# Patient Record
Sex: Male | Born: 1975 | Hispanic: No | Marital: Single | Smoking: Current every day smoker
Health system: Southern US, Community
[De-identification: ages and names within clinical notes are randomized; demographics above are authoritative.]

## PROBLEM LIST (undated history)

## (undated) DIAGNOSIS — J9383 Other pneumothorax: Secondary | ICD-10-CM

## (undated) DIAGNOSIS — L309 Dermatitis, unspecified: Secondary | ICD-10-CM

---

## 2000-02-12 ENCOUNTER — Emergency Department (HOSPITAL_COMMUNITY): Admission: EM | Admit: 2000-02-12 | Discharge: 2000-02-12 | Payer: Self-pay | Admitting: Emergency Medicine

## 2004-12-23 ENCOUNTER — Ambulatory Visit: Payer: Self-pay | Admitting: Internal Medicine

## 2004-12-24 ENCOUNTER — Ambulatory Visit: Payer: Self-pay | Admitting: Internal Medicine

## 2005-01-01 ENCOUNTER — Ambulatory Visit: Payer: Self-pay | Admitting: Internal Medicine

## 2005-01-11 ENCOUNTER — Ambulatory Visit: Payer: Self-pay | Admitting: Internal Medicine

## 2005-01-18 ENCOUNTER — Ambulatory Visit: Payer: Self-pay | Admitting: Internal Medicine

## 2009-07-09 ENCOUNTER — Inpatient Hospital Stay (HOSPITAL_COMMUNITY): Admission: EM | Admit: 2009-07-09 | Discharge: 2009-07-11 | Payer: Self-pay | Admitting: Emergency Medicine

## 2009-07-09 ENCOUNTER — Ambulatory Visit: Payer: Self-pay | Admitting: Surgery

## 2009-07-17 ENCOUNTER — Ambulatory Visit: Payer: Self-pay | Admitting: Surgery

## 2009-08-19 ENCOUNTER — Encounter: Admission: RE | Admit: 2009-08-19 | Discharge: 2009-08-19 | Payer: Self-pay | Admitting: Surgery

## 2009-08-20 ENCOUNTER — Ambulatory Visit: Payer: Self-pay | Admitting: Surgery

## 2010-07-19 ENCOUNTER — Encounter: Payer: Self-pay | Admitting: Surgery

## 2010-09-13 LAB — CBC
Hemoglobin: 16.7 g/dL (ref 13.0–17.0)
RBC: 5.05 MIL/uL (ref 4.22–5.81)

## 2010-09-13 LAB — BASIC METABOLIC PANEL
BUN: 11 mg/dL (ref 6–23)
CO2: 27 mEq/L (ref 19–32)
Calcium: 9.4 mg/dL (ref 8.4–10.5)
GFR calc non Af Amer: >60 mL/min (ref >60)
Glucose, Bld: 94 mg/dL (ref 70–99)

## 2010-09-13 LAB — PROTIME-INR
INR: 0.96 (ref 0.00–1.49)
Prothrombin Time: 12.7 seconds (ref 11.6–15.2)

## 2010-09-13 LAB — DIFFERENTIAL
Eosinophils Relative: 1 % (ref 0–5)
Lymphocytes Relative: 25 % (ref 12–46)
Monocytes Absolute: 0.6 10*3/uL (ref 0.1–1.0)

## 2010-10-05 DIAGNOSIS — Z0271 Encounter for disability determination: Secondary | ICD-10-CM
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
## 2010-12-24 DIAGNOSIS — Z0271 Encounter for disability determination: Secondary | ICD-10-CM

## 2013-09-30 ENCOUNTER — Emergency Department (HOSPITAL_COMMUNITY)
Admission: EM | Admit: 2013-09-30 | Discharge: 2013-09-30 | Disposition: A | Discharge status: Home / Self Care | Payer: Self-pay | Attending: Emergency Medicine | Admitting: Emergency Medicine

## 2013-09-30 ENCOUNTER — Encounter (HOSPITAL_COMMUNITY): Payer: Self-pay | Admitting: Emergency Medicine

## 2013-09-30 DIAGNOSIS — H1132 Conjunctival hemorrhage, left eye: Secondary | ICD-10-CM

## 2013-09-30 DIAGNOSIS — H1130 Conjunctival hemorrhage, unspecified eye: Secondary | ICD-10-CM | POA: Insufficient documentation

## 2013-09-30 DIAGNOSIS — Z8709 Personal history of other diseases of the respiratory system: Secondary | ICD-10-CM | POA: Insufficient documentation

## 2013-09-30 DIAGNOSIS — Z23 Encounter for immunization: Secondary | ICD-10-CM | POA: Insufficient documentation

## 2013-09-30 DIAGNOSIS — S01309A Unspecified open wound of unspecified ear, initial encounter: Secondary | ICD-10-CM | POA: Insufficient documentation

## 2013-09-30 DIAGNOSIS — S01319A Laceration without foreign body of unspecified ear, initial encounter: Secondary | ICD-10-CM

## 2013-09-30 HISTORY — DX: Other pneumothorax: J93.83

## 2013-09-30 MED ORDER — TETANUS-DIPHTH-ACELL PERTUSSIS 5-2.5-18.5 LF-MCG/0.5 IM SUSP
0.5000 mL | Freq: Once | INTRAMUSCULAR | Status: AC
  Administered 2013-09-30: 0.5 mL via INTRAMUSCULAR
  Filled 2013-09-30: qty 0.5
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
## 2013-09-30 NOTE — ED Provider Notes (Signed)
Medical screening examination/treatment/procedure(s) were performed by non-physician practitioner and as supervising physician I was immediately available for consultation/collaboration.   EKG Interpretation None        Aubri Gathright, MD 09/30/13 1720 
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
## 2015-08-10 ENCOUNTER — Inpatient Hospital Stay (HOSPITAL_COMMUNITY): Payer: Self-pay

## 2015-08-10 ENCOUNTER — Emergency Department (HOSPITAL_COMMUNITY): Payer: Self-pay

## 2015-08-10 ENCOUNTER — Encounter (HOSPITAL_COMMUNITY): Payer: Self-pay | Admitting: Emergency Medicine

## 2015-08-10 ENCOUNTER — Inpatient Hospital Stay (HOSPITAL_COMMUNITY)
Admission: EM | Admit: 2015-08-10 | Discharge: 2015-08-17 | DRG: 165 | Disposition: A | Discharge status: Home / Self Care | Payer: Self-pay | Attending: Thoracic Surgery (Cardiothoracic Vascular Surgery) | Admitting: Thoracic Surgery (Cardiothoracic Vascular Surgery)

## 2015-08-10 DIAGNOSIS — Z09 Encounter for follow-up examination after completed treatment for conditions other than malignant neoplasm: Secondary | ICD-10-CM

## 2015-08-10 DIAGNOSIS — J939 Pneumothorax, unspecified: Secondary | ICD-10-CM

## 2015-08-10 DIAGNOSIS — J9383 Other pneumothorax: Principal | ICD-10-CM | POA: Diagnosis present

## 2015-08-10 DIAGNOSIS — J439 Emphysema, unspecified: Secondary | ICD-10-CM | POA: Diagnosis present

## 2015-08-10 DIAGNOSIS — F17200 Nicotine dependence, unspecified, uncomplicated: Secondary | ICD-10-CM | POA: Diagnosis present

## 2015-08-10 DIAGNOSIS — Z01818 Encounter for other preprocedural examination: Secondary | ICD-10-CM

## 2015-08-10 DIAGNOSIS — Z9689 Presence of other specified functional implants: Secondary | ICD-10-CM

## 2015-08-10 DIAGNOSIS — Z4682 Encounter for fitting and adjustment of non-vascular catheter: Secondary | ICD-10-CM

## 2015-08-10 LAB — BASIC METABOLIC PANEL
ANION GAP: 14 (ref 5–15)
BUN: 12 mg/dL (ref 6–20)
CHLORIDE: 103 mmol/L (ref 101–111)
CO2: 25 mmol/L (ref 22–32)
CREATININE: 0.93 mg/dL (ref 0.61–1.24)
Calcium: 9.4 mg/dL (ref 8.9–10.3)
GFR calc non Af Amer: >60 mL/min (ref >60)
Glucose, Bld: 92 mg/dL (ref 65–99)
Potassium: 4.4 mmol/L (ref 3.5–5.1)
Sodium: 142 mmol/L (ref 135–145)

## 2015-08-10 LAB — CBC
HEMATOCRIT: 44.2 % (ref 39.0–52.0)
HEMOGLOBIN: 14.5 g/dL (ref 13.0–17.0)
MCH: 30.9 pg (ref 26.0–34.0)
MCHC: 32.8 g/dL (ref 30.0–36.0)
MCV: 94 fL (ref 78.0–100.0)
Platelets: 274 10*3/uL (ref 150–400)
RBC: 4.7 MIL/uL (ref 4.22–5.81)
RDW: 12.5 % (ref 11.5–15.5)
WBC: 10.3 10*3/uL (ref 4.0–10.5)

## 2015-08-10 LAB — I-STAT TROPONIN, ED: Troponin i, poc: 0 ng/mL (ref 0.00–0.08)

## 2015-08-10 LAB — APTT: APTT: 29 s (ref 24–37)

## 2015-08-10 MED ORDER — DIPHENHYDRAMINE HCL 12.5 MG/5ML PO ELIX: 12.5000 mg | ORAL_SOLUTION | Freq: Four times a day (QID) | ORAL | Status: DC | PRN

## 2015-08-10 MED ORDER — SODIUM CHLORIDE 0.9 % IV SOLN
INTRAVENOUS | Status: DC
  Administered 2015-08-12 – 2015-08-13 (×2): via INTRAVENOUS

## 2015-08-10 MED ORDER — SODIUM CHLORIDE 0.9% FLUSH: 9.0000 mL | INTRAVENOUS | Status: DC | PRN

## 2015-08-10 MED ORDER — ALUM & MAG HYDROXIDE-SIMETH 200-200-20 MG/5ML PO SUSP: 30.0000 mL | ORAL | Status: DC | PRN

## 2015-08-10 MED ORDER — MORPHINE SULFATE 2 MG/ML IV SOLN
INTRAVENOUS | Status: DC
  Administered 2015-08-10: 2 mg via INTRAVENOUS
  Administered 2015-08-10: 20:00:00 via INTRAVENOUS
  Administered 2015-08-11 (×2): 1.5 mg via INTRAVENOUS
  Administered 2015-08-11: 2 mg via INTRAVENOUS
  Administered 2015-08-11: 4.5 mg via INTRAVENOUS
  Administered 2015-08-12 – 2015-08-13 (×5): 1.5 mg via INTRAVENOUS
  Filled 2015-08-10: qty 25

## 2015-08-10 MED ORDER — ZOLPIDEM TARTRATE 5 MG PO TABS: 10.0000 mg | ORAL_TABLET | Freq: Every evening | ORAL | Status: DC | PRN

## 2015-08-10 MED ORDER — DIPHENHYDRAMINE HCL 50 MG/ML IJ SOLN: 12.5000 mg | Freq: Four times a day (QID) | INTRAMUSCULAR | Status: DC | PRN

## 2015-08-10 MED ORDER — ENOXAPARIN SODIUM 40 MG/0.4ML ~~LOC~~ SOLN
40.0000 mg | SUBCUTANEOUS | Status: DC
  Administered 2015-08-10 – 2015-08-12 (×3): 40 mg via SUBCUTANEOUS
  Filled 2015-08-10 (×3): qty 0.4

## 2015-08-10 MED ORDER — MIDAZOLAM HCL 2 MG/2ML IJ SOLN
2.0000 mg | Freq: Once | INTRAMUSCULAR | Status: AC
  Administered 2015-08-10: 2 mg via INTRAVENOUS
  Filled 2015-08-10: qty 2

## 2015-08-10 MED ORDER — ACETAMINOPHEN 500 MG PO TABS: 1000.0000 mg | ORAL_TABLET | Freq: Four times a day (QID) | ORAL | Status: DC | PRN

## 2015-08-10 MED ORDER — ONDANSETRON HCL 4 MG/2ML IJ SOLN: 4.0000 mg | Freq: Four times a day (QID) | INTRAMUSCULAR | Status: DC | PRN

## 2015-08-10 MED ORDER — NALOXONE HCL 0.4 MG/ML IJ SOLN: 0.4000 mg | INTRAMUSCULAR | Status: DC | PRN

## 2015-08-10 MED ORDER — LIDOCAINE HCL (PF) 1 % IJ SOLN
0.0000 mL | Freq: Once | INTRAMUSCULAR | Status: AC | PRN
  Administered 2015-08-10: 30 mL via INTRADERMAL
  Filled 2015-08-10: qty 30

## 2015-08-10 MED ORDER — MORPHINE SULFATE (PF) 4 MG/ML IV SOLN
4.0000 mg | Freq: Once | INTRAVENOUS | Status: AC
  Administered 2015-08-10: 4 mg via INTRAVENOUS
  Filled 2015-08-10: qty 1

## 2015-08-10 MED ORDER — MAGNESIUM HYDROXIDE 400 MG/5ML PO SUSP: 30.0000 mL | Freq: Every day | ORAL | Status: DC | PRN

## 2015-08-10 NOTE — Procedures (Signed)
Informed consent obtained Given 4 mg morphine and 2 mg versed Time out performed 30 F chest tube placed using 15 ml 1% lidocaine for local anesthesia +rush of air Tolerated well CT to suction  Viviann Spare C. Dorris Fetch, MD [facility] [phone]

## 2015-08-10 NOTE — ED Notes (Signed)
Successful chest tube placement at 1730. Pt tolerated well. Pt alert and oriented

## 2015-08-10 NOTE — ED Notes (Signed)
Attempted Report x1.   

## 2015-08-10 NOTE — ED Notes (Signed)
Pt states "I think I have a pneumothorax". Pt c/o difficulty breathing. Pt talking in clear sentences, in NAD. Lung sounds diminished bilaterally.

## 2015-08-10 NOTE — H&P (Signed)
Victor Jefferson is an 40 y.o. male.   Chief Complaint: chest pain HPI: 40 yo man with a history of bilateral spontaneous pneumothoraces, most recently on the right side in 2011. Presented to ED today with a cc/o sudden onset of right sided chest pain and shortness of breath. + pain with inspiration. SOB has eased off since arrival.  Past Medical History  Diagnosis Date  . Spontaneous pneumothorax     PSH Left chest tube 2010 Right chest tube 2011  Family history  Noncontributory  Social History:  reports that he has been smoking.  He does not have any smokeless tobacco history on file. He reports that he does not drink alcohol or use illicit drugs.  Allergies: No Known Allergies  MEDICATIONS: none  Results for orders placed or performed during the hospital encounter of 08/10/15 (from the past 48 hour(s))  Basic metabolic panel     Status: None   Collection Time: 08/10/15  3:21 PM  Result Value Ref Range   Sodium 142 135 - 145 mmol/L   Potassium 4.4 3.5 - 5.1 mmol/L   Chloride 103 101 - 111 mmol/L   CO2 25 22 - 32 mmol/L   Glucose, Bld 92 65 - 99 mg/dL   BUN 12 6 - 20 mg/dL   Creatinine, Ser 0.93 0.61 - 1.24 mg/dL   Calcium 9.4 8.9 - 10.3 mg/dL   GFR calc non Af Amer >60 >60 mL/min   GFR calc Af Amer >60 >60 mL/min    Comment: (NOTE) The eGFR has been calculated using the CKD EPI equation. This calculation has not been validated in all clinical situations. eGFR's persistently <60 mL/min signify possible Chronic Kidney Disease.    Anion gap 14 5 - 15  CBC     Status: None   Collection Time: 08/10/15  3:21 PM  Result Value Ref Range   WBC 10.3 4.0 - 10.5 K/uL   RBC 4.70 4.22 - 5.81 MIL/uL   Hemoglobin 14.5 13.0 - 17.0 g/dL   HCT 44.2 39.0 - 52.0 %   MCV 94.0 78.0 - 100.0 fL   MCH 30.9 26.0 - 34.0 pg   MCHC 32.8 30.0 - 36.0 g/dL   RDW 12.5 11.5 - 15.5 %   Platelets 274 150 - 400 K/uL  I-stat troponin, ED (not at [facility])     Status: None   Collection Time: 08/10/15   3:40 PM  Result Value Ref Range   Troponin i, poc 0.00 0.00 - 0.08 ng/mL   Comment 3            Comment: Due to the release kinetics of cTnI, a negative result within the first hours of the onset of symptoms does not rule out myocardial infarction with certainty. If myocardial infarction is still suspected, repeat the test at appropriate intervals.    Dg Chest 2 View  08/10/2015  CLINICAL DATA:  40 year old with prior history of spontaneous pneumothorax in 2006 and 2010, presenting with acute onset of right-sided chest pain earlier today, similar to those episodes. EXAM: CHEST  2 VIEW COMPARISON:  2/20 07/1009 and earlier. FINDINGS: Apical, lateral, anterior and basilar right pneumothorax on the order of 25% or so. Small right pleural effusion demonstrating an air-fluid level. Blebs in both lung apices. Small nodular opacity projected over the right mid lung on the PA image, not clearly localized on the lateral image and not visible on prior examinations. Lungs otherwise clear. No left pleural effusion. Cardiomediastinal silhouette unremarkable, unchanged. Visualized  bony thorax intact. IMPRESSION: 1. Spontaneous right hydropneumothorax on the order of 25% or so. 2. Blebs involving the lung apices, indicating age advanced emphysema. 3. Possible subcentimeter nodule in the right mid lung on the PA image, not visible on the lateral and not visible on prior examinations. CT of the chest with contrast may be helpful in further evaluation after treatment of the pneumothorax. Electronically Signed   By: Evangeline Dakin M.D.   On: 08/10/2015 15:49    Review of Systems  Respiratory: Positive for shortness of breath.        Pleuritic right chest pain  All other systems reviewed and are negative.   Blood pressure 113/71, pulse 77, temperature 98 F (36.7 C), resp. rate 20, SpO2 96 %. Physical Exam  Vitals reviewed. Constitutional: He is oriented to person, place, and time. He appears distressed  (mildly).  thin  HENT:  Head: Normocephalic and atraumatic.  Eyes: Conjunctivae and EOM are normal. Pupils are equal, round, and reactive to light. No scleral icterus.  Neck: Neck supple. No tracheal deviation present. No thyromegaly present.  Cardiovascular: Normal rate, regular rhythm, normal heart sounds and intact distal pulses.   No murmur heard. Respiratory: Effort normal. He has no wheezes.  Diminished BS on right  GI: Soft. He exhibits no distension. There is no tenderness.  Musculoskeletal: Normal range of motion. He exhibits no edema.  Lymphadenopathy:    He has no cervical adenopathy.  Neurological: He is alert and oriented to person, place, and time. No cranial nerve deficit. He exhibits normal muscle tone.  Skin: Skin is warm and dry.     Assessment/Plan 40 yo man with history of bilateral pneumothoraces who presents with a second spontaneous pneumothorax on the right. He needs a chest tube placement to reexpand the lung and then will need a CT to evaluate anatomy and right VATS stapling of blebs for definitive treatment.  I informed the patient and his parents of the indications, risks, benefits and alternatives. They are familiar with the procedure. He understands risks of CT placement include but are not limited to bleeding, infection, tube malposition as well as the possibility of other unforeseeable complications.  He agrees to CT placement  Will premed with morphine and versed IV  Melrose Nakayama, MD 08/10/2015, 5:03 PM

## 2015-08-10 NOTE — ED Notes (Signed)
Obtained consent for Chest tube placement. Supplies at bedside

## 2015-08-10 NOTE — ED Notes (Signed)
Radiology at bedside

## 2015-08-10 NOTE — ED Provider Notes (Signed)
CSN: [id number]     Arrival date & time 08/10/15  1502 History   First MD Initiated Contact with Patient 08/10/15 1557     Chief Complaint  Patient presents with  . Shortness of Breath     (Consider location/radiation/quality/duration/timing/severity/associated sxs/prior Treatment) Patient is a 40 y.o. male presenting with chest pain.  Chest Pain Pain location:  R chest Pain quality: aching   Pain radiates to:  Does not radiate Pain radiates to the back: yes   Pain severity:  Moderate Onset quality:  Sudden Duration:  2 hours Timing:  Constant Progression:  Unchanged Chronicity:  Recurrent (Similar to prior pneumothoraces) Context comment:  Patient was stretching Relieved by:  Nothing Worsened by:  Nothing tried Associated symptoms: shortness of breath (Mild)   Associated symptoms: no abdominal pain, no back pain, no cough, no dizziness, no fatigue, no fever, no headache, no nausea, no palpitations, not vomiting and no weakness     Past Medical History  Diagnosis Date  . Spontaneous pneumothorax    History reviewed. No pertinent past surgical history. No family history on file. Social History  Substance Use Topics  . Smoking status: Current Every Day Smoker  . Smokeless tobacco: None  . Alcohol Use: No    Review of Systems  Constitutional: Negative for fever, chills, appetite change and fatigue.  HENT: Negative for congestion, ear pain, facial swelling, mouth sores and sore throat.   Eyes: Negative for visual disturbance.  Respiratory: Positive for shortness of breath (Mild). Negative for cough and chest tightness.   Cardiovascular: Positive for chest pain. Negative for palpitations.  Gastrointestinal: Negative for nausea, vomiting, abdominal pain, diarrhea and blood in stool.  Endocrine: Negative for cold intolerance and heat intolerance.  Genitourinary: Negative for frequency, decreased urine volume and difficulty urinating.  Musculoskeletal: Negative for back  pain and neck stiffness.  Skin: Negative for rash.  Neurological: Negative for dizziness, weakness, light-headedness and headaches.  All other systems reviewed and are negative.     Allergies  Review of patient's allergies indicates no known allergies.  Home Medications   Prior to Admission medications   Not on File   BP 106/67 mmHg  Pulse 80  Temp(Src) 98 F (36.7 C)  Resp 20  SpO2 94% Physical Exam  Constitutional: He is oriented to person, place, and time. He appears well-nourished. No distress.  HENT:  Head: Normocephalic and atraumatic.  Right Ear: External ear normal.  Left Ear: External ear normal.  Eyes: Pupils are equal, round, and reactive to light. Right eye exhibits no discharge. Left eye exhibits no discharge. No scleral icterus.  Neck: Normal range of motion. Neck supple.  Cardiovascular: Normal rate.  Exam reveals no gallop and no friction rub.   No murmur heard. Pulmonary/Chest: Effort normal. No stridor. No respiratory distress. He has decreased breath sounds in the left middle field and the left lower field. He has no wheezes. He has no rales. He exhibits no tenderness.  Abdominal: Soft. He exhibits no distension and no mass. There is no tenderness. There is no rebound and no guarding.  Musculoskeletal: He exhibits no edema or tenderness.  Neurological: He is alert and oriented to person, place, and time.  Skin: Skin is warm and dry. No rash noted. He is not diaphoretic. No erythema.    ED Course  Procedures (including critical care time) Labs Review Labs Reviewed  BASIC METABOLIC PANEL  CBC  PROTIME-INR  APTT  I-STAT TROPOININ, ED    Imaging Review Dg Chest  2 View  08/10/2015  CLINICAL DATA:  40 year old with prior history of spontaneous pneumothorax in 2006 and 2010, presenting with acute onset of right-sided chest pain earlier today, similar to those episodes. EXAM: CHEST  2 VIEW COMPARISON:  2/20 07/1009 and earlier. FINDINGS: Apical, lateral,  anterior and basilar right pneumothorax on the order of 25% or so. Small right pleural effusion demonstrating an air-fluid level. Blebs in both lung apices. Small nodular opacity projected over the right mid lung on the PA image, not clearly localized on the lateral image and not visible on prior examinations. Lungs otherwise clear. No left pleural effusion. Cardiomediastinal silhouette unremarkable, unchanged. Visualized bony thorax intact. IMPRESSION: 1. Spontaneous right hydropneumothorax on the order of 25% or so. 2. Blebs involving the lung apices, indicating age advanced emphysema. 3. Possible subcentimeter nodule in the right mid lung on the PA image, not visible on the lateral and not visible on prior examinations. CT of the chest with contrast may be helpful in further evaluation after treatment of the pneumothorax. Electronically Signed   By: Hulan Saas M.D.   On: 08/10/2015 15:49   I have personally reviewed and evaluated these images and lab results as part of my medical decision-making.   EKG Interpretation   Date/Time:  Sunday August 10 2015 15:15:04 EST Ventricular Rate:  78 PR Interval:  124 QRS Duration: 90 QT Interval:  372 QTC Calculation: 424 R Axis:   -95 Text Interpretation:   Suspect arm lead reversal, interpretation  assumes no reversal Normal sinus rhythm with sinus arrhythmia Right  superior axis deviation Pulmonary disease pattern Abnormal ECG Confirmed  by Rubin Payor  MD, Harrold Donath [phone]) on 08/10/2015 3:57:22 PM      MDM   Confirmed spontaneous right pneumothorax by chest x-ray. Patient has a history of prior pneumothoraces requiring chest tube placement. CT surgery consulted. They placed a chest tube. Patient will be admitted to their service for further management.   Patient seen in conjunction with Dr. Rubin Payor.  Final diagnoses:  Chest tube in place  Spontaneous pneumothorax        Drema Pry, MD 08/10/15 1745  Benjiman Core,  MD 08/11/15 [phone]

## 2015-08-11 ENCOUNTER — Inpatient Hospital Stay (HOSPITAL_COMMUNITY): Payer: Self-pay

## 2015-08-11 LAB — PROTIME-INR
INR: 1.06 (ref 0.00–1.49)
Prothrombin Time: 14 seconds (ref 11.6–15.2)

## 2015-08-11 NOTE — Progress Notes (Addendum)
      [address]       [name] [address]             [phone]            Subjective: Patient just finished eating breakfast. No complaints this am.  Objective: Vital signs in last 24 hours: Temp:  [98 F (36.7 C)-98.3 F (36.8 C)] 98.3 F (36.8 C) (02/13 0413) Pulse Rate:  [62-85] 62 (02/13 0413) Cardiac Rhythm:  [-] Normal sinus rhythm (02/12 2040) Resp:  [17-23] 18 (02/13 0413) BP: (103-113)/(67-89) 109/67 mmHg (02/13 0413) SpO2:  [94 %-100 %] 96 % (02/13 0413) Weight:  [120 lb 9.5 oz (54.7 kg)] 120 lb 9.5 oz (54.7 kg) (02/12 1905)     Intake/Output from previous day: 02/12 0701 - 02/13 0700 In: -  Out: 450 [Urine:450]   Physical Exam:  Cardiovascular: RRR Pulmonary: Mostly clear Abdomen: Soft, non tender, bowel sounds present. Extremities: No lower extremity edema. Wounds: Clean and dry.  No erythema or signs of infection. Chest Tube: not to suction this am  Lab Results: CBC: Recent Labs  08/10/15 1521  WBC 10.3  HGB 14.5  HCT 44.2  PLT 274   BMET:  Recent Labs  08/10/15 1521  NA 142  K 4.4  CL 103  CO2 25  GLUCOSE 92  BUN 12  CREATININE 0.93  CALCIUM 9.4    PT/INR:  Recent Labs  08/11/15 0240  LABPROT 14.0  INR 1.06   ABG:  INR: Will add last result for INR, ABG once components are confirmed Will add last 4 CBG results once components are confirmed  Assessment/Plan:  1. CV - SR 2.  Pulmonary - Chest tube supposed to be to suction but not connected this am. Put to suction. No air leak. Await CXR results. CT done yesterday showed minimal right pneumothorax, bi apical subpleural blebs. Will require VATS at some point.  Koda Routon MPA-C 08/11/2015,7:31 AM

## 2015-08-11 NOTE — Progress Notes (Signed)
Utilization review completed.  

## 2015-08-12 ENCOUNTER — Inpatient Hospital Stay (HOSPITAL_COMMUNITY): Payer: Self-pay

## 2015-08-12 LAB — ABO/RH: ABO/RH(D): O POS

## 2015-08-12 LAB — TYPE AND SCREEN
ABO/RH(D): O POS
ANTIBODY SCREEN: NEGATIVE

## 2015-08-12 MED ORDER — DEXTROSE 5 % IV SOLN
1.5000 g | INTRAVENOUS | Status: AC
  Administered 2015-08-13: 1.5 g via INTRAVENOUS

## 2015-08-12 NOTE — Progress Notes (Addendum)
    [address]       [place] [address]             [phone]        Procedure(s) (LRB): VIDEO ASSISTED THORACOSCOPY, plerual abrasion (Right) BLEBECTOMY (Right) Subjective: No new issues, breathing is comfortable, minor pain  Objective: Vital signs in last 24 hours: Temp:  [98.4 F (36.9 C)-99.3 F (37.4 C)] 98.4 F (36.9 C) (02/14 0518) Pulse Rate:  [53-70] 53 (02/14 0518) Cardiac Rhythm:  [-] Normal sinus rhythm (02/13 1900) Resp:  [15-21] 15 (02/14 0518) BP: (110-118)/(62-75) 112/69 mmHg (02/14 0518) SpO2:  [94 %-99 %] 97 % (02/14 0518) FiO2 (%):  [0.5 %] 0.5 % (02/13 1332)  Hemodynamic parameters for last 24 hours:    Intake/Output from previous day: 02/13 0701 - 02/14 0700 In: 720 [P.O.:720] Out: 1113 [Urine:1100; Chest Tube:13] Intake/Output this shift:    General appearance: alert, cooperative and no distress Heart: regular rate and rhythm Lungs: clear to auscultation bilaterally  Lab Results:  Recent Labs  08/10/15 1521  WBC 10.3  HGB 14.5  HCT 44.2  PLT 274   BMET:  Recent Labs  08/10/15 1521  NA 142  K 4.4  CL 103  CO2 25  GLUCOSE 92  BUN 12  CREATININE 0.93  CALCIUM 9.4    PT/INR:  Recent Labs  08/11/15 0240  LABPROT 14.0  INR 1.06   ABG No results found for: PHART, HCO3, TCO2, ACIDBASEDEF, O2SAT CBG (last 3)  No results for input(s): GLUCAP in the last 72 hours.  Meds Scheduled Meds: . enoxaparin (LOVENOX) injection  40 mg Subcutaneous Q24H  . morphine   Intravenous 6 times per day   Continuous Infusions: . sodium chloride 10 mL/hr at 08/12/15 0017   PRN Meds:.acetaminophen, alum & mag hydroxide-simeth, diphenhydrAMINE **OR** diphenhydrAMINE, magnesium hydroxide, naloxone **AND** sodium chloride flush, ondansetron (ZOFRAN) IV, zolpidem  Xrays Dg Chest 2 View  08/10/2015  CLINICAL DATA:  40-year-old with prior history of spontaneous pneumothorax in 2006 and 2010, presenting with acute onset of  right-sided chest pain earlier today, similar to those episodes. EXAM: CHEST  2 VIEW COMPARISON:  2/20 07/1009 and earlier. FINDINGS: Apical, lateral, anterior and basilar right pneumothorax on the order of 25% or so. Small right pleural effusion demonstrating an air-fluid level. Blebs in both lung apices. Small nodular opacity projected over the right mid lung on the PA image, not clearly localized on the lateral image and not visible on prior examinations. Lungs otherwise clear. No left pleural effusion. Cardiomediastinal silhouette unremarkable, unchanged. Visualized bony thorax intact. IMPRESSION: 1. Spontaneous right hydropneumothorax on the order of 25% or so. 2. Blebs involving the lung apices, indicating age advanced emphysema. 3. Possible subcentimeter nodule in the right mid lung on the PA image, not visible on the lateral and not visible on prior examinations. CT of the chest with contrast may be helpful in further evaluation after treatment of the pneumothorax. Electronically Signed   By: Thomas  Lawrence M.D.   On: 08/10/2015 15:49   Ct Chest Wo Contrast  08/10/2015  CLINICAL DATA:  40-year-old male with spontaneous pneumothorax and right-sided chest tube. EXAM: CT CHEST WITHOUT CONTRAST TECHNIQUE: Multidetector CT imaging of the chest was performed following the standard protocol without IV contrast. COMPARISON:  Radiograph dated 08/10/2015 FINDINGS: Evaluation of this exam is limited in the absence of intravenous contrast. The right-sided chest tube is noted entering from the right anterior chest wall between the fourth and   fifth ribs with tip in the right anterior apical region. Minimal pneumothorax noted along the right anterior pleural surface as well as along the right hemidiaphragm. There are biapical subpleural blebs measuring up to 4.3 cm on the left. The lungs are otherwise clear. There is no pleural effusion. The central airways are patent. The thoracic aorta and central pulmonary arteries  are grossly unremarkable on this noncontrast study. There is no cardiomegaly or pericardial effusion. No hilar or mediastinal adenopathy. The esophagus and the thyroid gland appear unremarkable. There is no axillary adenopathy. Small pockets of air noted adjacent to the chest tube in the right anterior chest wall. The chest wall soft tissues are otherwise unremarkable. The osseous structures are intact. The visualized upper abdomen is grossly unremarkable. IMPRESSION: Right-sided chest tube with tip in the right apical region. Minimal pneumothorax noted along the right anterior pleural surface as well as along the right hemidiaphragm. Biapical subpleural blebs. Electronically Signed   By: Elgie Collard M.D.   On: 08/10/2015 23:39   Dg Chest 1v Repeat Same Day  08/10/2015  CLINICAL DATA:  Status post chest tube placement EXAM: CHEST - 1 VIEW SAME DAY COMPARISON:  Film from earlier in the same day FINDINGS: Right-sided chest tube is now seen with the tip overlying the right lung apex. The previously seen pneumothorax has been resolved. Left lung remains clear. The cardiac shadow is stable. No bony abnormality is seen. IMPRESSION: Resolution of right-sided pneumothorax following chest tube placement. Electronically Signed   By: Alcide Clever M.D.   On: 08/10/2015 18:16   Dg Chest Port 1 View  08/12/2015  CLINICAL DATA:  History of pneumothorax EXAM: PORTABLE CHEST 1 VIEW COMPARISON:  08/11/2015 FINDINGS: Cardiac shadow is within normal limits. The lungs are well aerated bilaterally. No focal infiltrate or sizable effusion is seen. No pneumothorax is noted. Right chest tube is again identified. Minimal scarring is noted in the apices bilaterally. IMPRESSION: No change from the prior exam.  No recurrent pneumothorax seen. Electronically Signed   By: Alcide Clever M.D.   On: 08/12/2015 08:16   Dg Chest Port 1 View  08/11/2015  CLINICAL DATA:  Right chest tube. EXAM: PORTABLE CHEST 1 VIEW COMPARISON:  CT chest  and chest radiographs 08/10/2015. FINDINGS: Trachea is midline. Heart size normal. Right chest tube terminates at the apex of the right hemi thorax. Bullous changes at the apices. No definite pneumothorax. Lungs are otherwise clear. No pleural fluid. IMPRESSION: 1. Right chest tube in place without pneumothorax. 2. Bullous changes at the apices. Electronically Signed   By: Leanna Battles M.D.   On: 08/11/2015 08:45    Assessment/Plan: S/P Procedure(s) (LRB): VIDEO ASSISTED THORACOSCOPY, plerual abrasion (Right) BLEBECTOMY (Right) 1 CXR is stable, no air leak, plan for VATS tomorrow- cont current rx/tx   LOS: 2 days    Victor [place],Victor Jefferson 08/12/2015  Patient seen and examined, agree with above  I recommended he undergo surgical correction for a recurrent right spontaneous pneumothorax. I informed him of the general nature of the procedure, the need for general anesthesia, the incisions to be used, the success rate(98%), the hospital stay and overall recovery. I reviewed the indications, risks, benefits and alternatives. He understands the risks include but are not limited to death (<1%), DVT, PE, bleeding, possible need for transfusion, infection, prolonged air leak, recurrent pneumothorax, as well as the possibility of other unforeseeable complications.  He agrees to proceed. For OR tomorrow.  Salvatore Decent Dorris Fetch, MD [facility] (  [phone]  

## 2015-08-13 ENCOUNTER — Inpatient Hospital Stay (HOSPITAL_COMMUNITY): Payer: Self-pay | Admitting: Certified Registered Nurse Anesthetist

## 2015-08-13 ENCOUNTER — Inpatient Hospital Stay (HOSPITAL_COMMUNITY): Payer: Self-pay

## 2015-08-13 ENCOUNTER — Encounter (HOSPITAL_COMMUNITY)
Admission: EM | Disposition: A | Discharge status: Home / Self Care | Payer: Self-pay | Source: Home / Self Care | Attending: Thoracic Surgery (Cardiothoracic Vascular Surgery)

## 2015-08-13 DIAGNOSIS — J9311 Primary spontaneous pneumothorax: Secondary | ICD-10-CM

## 2015-08-13 DIAGNOSIS — Z09 Encounter for follow-up examination after completed treatment for conditions other than malignant neoplasm: Secondary | ICD-10-CM

## 2015-08-13 HISTORY — PX: VIDEO ASSISTED THORACOSCOPY: SHX5073

## 2015-08-13 HISTORY — PX: STAPLING OF BLEBS: SHX6429

## 2015-08-13 SURGERY — VIDEO ASSISTED THORACOSCOPY
Anesthesia: General | Site: Chest | Laterality: Right

## 2015-08-13 MED ORDER — ONDANSETRON HCL 4 MG/2ML IJ SOLN: 4.0000 mg | Freq: Four times a day (QID) | INTRAMUSCULAR | Status: DC | PRN

## 2015-08-13 MED ORDER — FENTANYL CITRATE (PF) 250 MCG/5ML IJ SOLN
INTRAMUSCULAR | Status: AC
  Filled 2015-08-13: qty 5

## 2015-08-13 MED ORDER — LACTATED RINGERS IV SOLN: INTRAVENOUS | Status: DC

## 2015-08-13 MED ORDER — OXYCODONE HCL 5 MG PO TABS
5.0000 mg | ORAL_TABLET | ORAL | Status: DC | PRN
  Administered 2015-08-13 – 2015-08-16 (×6): 5 mg via ORAL
  Administered 2015-08-16: 10 mg via ORAL
  Filled 2015-08-13 (×2): qty 1
  Filled 2015-08-13: qty 2
  Filled 2015-08-13 (×5): qty 1

## 2015-08-13 MED ORDER — LEVALBUTEROL HCL 0.63 MG/3ML IN NEBU
0.6300 mg | INHALATION_SOLUTION | Freq: Four times a day (QID) | RESPIRATORY_TRACT | Status: DC
  Administered 2015-08-13: 0.63 mg via RESPIRATORY_TRACT
  Filled 2015-08-13: qty 3

## 2015-08-13 MED ORDER — ACETAMINOPHEN 500 MG PO TABS
1000.0000 mg | ORAL_TABLET | Freq: Four times a day (QID) | ORAL | Status: DC
  Administered 2015-08-13 – 2015-08-14 (×2): 1000 mg via ORAL
  Filled 2015-08-13 (×2): qty 2

## 2015-08-13 MED ORDER — 0.9 % SODIUM CHLORIDE (POUR BTL) OPTIME
TOPICAL | Status: DC | PRN
  Administered 2015-08-13: 1000 mL

## 2015-08-13 MED ORDER — LACTATED RINGERS IV SOLN
INTRAVENOUS | Status: DC | PRN
  Administered 2015-08-13: 13:00:00 via INTRAVENOUS

## 2015-08-13 MED ORDER — ONDANSETRON HCL 4 MG/2ML IJ SOLN
INTRAMUSCULAR | Status: DC | PRN
  Administered 2015-08-13: 4 mg via INTRAVENOUS

## 2015-08-13 MED ORDER — VANCOMYCIN HCL IN DEXTROSE 1-5 GM/200ML-% IV SOLN
1000.0000 mg | Freq: Two times a day (BID) | INTRAVENOUS | Status: AC
  Administered 2015-08-13: 1000 mg via INTRAVENOUS
  Filled 2015-08-13: qty 200

## 2015-08-13 MED ORDER — DIPHENHYDRAMINE HCL 50 MG/ML IJ SOLN: 12.5000 mg | Freq: Four times a day (QID) | INTRAMUSCULAR | Status: DC | PRN

## 2015-08-13 MED ORDER — POTASSIUM CHLORIDE IN NACL 20-0.9 MEQ/L-% IV SOLN
INTRAVENOUS | Status: DC
  Administered 2015-08-13: 100 mL/h via INTRAVENOUS
  Administered 2015-08-14 – 2015-08-16 (×4): via INTRAVENOUS
  Filled 2015-08-13 (×5): qty 1000

## 2015-08-13 MED ORDER — BISACODYL 5 MG PO TBEC
10.0000 mg | DELAYED_RELEASE_TABLET | Freq: Every day | ORAL | Status: DC
  Administered 2015-08-13 – 2015-08-17 (×5): 10 mg via ORAL
  Filled 2015-08-13 (×5): qty 2

## 2015-08-13 MED ORDER — HYDROMORPHONE HCL 1 MG/ML IJ SOLN
INTRAMUSCULAR | Status: AC
  Administered 2015-08-13: 0.5 mg via INTRAVENOUS
  Filled 2015-08-13: qty 1

## 2015-08-13 MED ORDER — SENNOSIDES-DOCUSATE SODIUM 8.6-50 MG PO TABS
1.0000 | ORAL_TABLET | Freq: Every day | ORAL | Status: DC
  Administered 2015-08-14 – 2015-08-16 (×3): 1 via ORAL
  Filled 2015-08-13 (×4): qty 1

## 2015-08-13 MED ORDER — DIPHENHYDRAMINE HCL 12.5 MG/5ML PO ELIX: 12.5000 mg | ORAL_SOLUTION | Freq: Four times a day (QID) | ORAL | Status: DC | PRN

## 2015-08-13 MED ORDER — SODIUM CHLORIDE 0.9% FLUSH: 9.0000 mL | INTRAVENOUS | Status: DC | PRN

## 2015-08-13 MED ORDER — MORPHINE SULFATE 2 MG/ML IV SOLN
INTRAVENOUS | Status: DC
  Administered 2015-08-13: 15:00:00 via INTRAVENOUS

## 2015-08-13 MED ORDER — NALOXONE HCL 0.4 MG/ML IJ SOLN: 0.4000 mg | INTRAMUSCULAR | Status: DC | PRN

## 2015-08-13 MED ORDER — SUGAMMADEX SODIUM 200 MG/2ML IV SOLN
INTRAVENOUS | Status: DC | PRN
  Administered 2015-08-13: 109.4 mg via INTRAVENOUS

## 2015-08-13 MED ORDER — FENTANYL CITRATE (PF) 100 MCG/2ML IJ SOLN
INTRAMUSCULAR | Status: DC | PRN
  Administered 2015-08-13: 50 ug via INTRAVENOUS
  Administered 2015-08-13: 200 ug via INTRAVENOUS

## 2015-08-13 MED ORDER — LIDOCAINE HCL (CARDIAC) 20 MG/ML IV SOLN
INTRAVENOUS | Status: DC | PRN
  Administered 2015-08-13: 60 mg via INTRAVENOUS

## 2015-08-13 MED ORDER — MIDAZOLAM HCL 2 MG/2ML IJ SOLN
INTRAMUSCULAR | Status: AC
  Filled 2015-08-13: qty 2

## 2015-08-13 MED ORDER — HYDROMORPHONE HCL 1 MG/ML IJ SOLN
0.5000 mg | INTRAMUSCULAR | Status: DC | PRN
  Administered 2015-08-13 (×3): 0.5 mg via INTRAVENOUS

## 2015-08-13 MED ORDER — TRAMADOL HCL 50 MG PO TABS: 50.0000 mg | ORAL_TABLET | Freq: Four times a day (QID) | ORAL | Status: DC | PRN

## 2015-08-13 MED ORDER — ACETAMINOPHEN 160 MG/5ML PO SOLN: 1000.0000 mg | Freq: Four times a day (QID) | ORAL | Status: DC

## 2015-08-13 MED ORDER — PROPOFOL 10 MG/ML IV BOLUS
INTRAVENOUS | Status: DC | PRN
  Administered 2015-08-13: 140 mg via INTRAVENOUS

## 2015-08-13 MED ORDER — DEXTROSE 5 % IV SOLN
1.5000 g | Freq: Two times a day (BID) | INTRAVENOUS | Status: AC
  Administered 2015-08-13 – 2015-08-14 (×2): 1.5 g via INTRAVENOUS
  Filled 2015-08-13 (×2): qty 1.5

## 2015-08-13 MED ORDER — PROPOFOL 10 MG/ML IV BOLUS
INTRAVENOUS | Status: AC
  Filled 2015-08-13: qty 20

## 2015-08-13 MED ORDER — LEVALBUTEROL HCL 0.63 MG/3ML IN NEBU
0.6300 mg | INHALATION_SOLUTION | Freq: Three times a day (TID) | RESPIRATORY_TRACT | Status: DC
  Administered 2015-08-14 (×2): 0.63 mg via RESPIRATORY_TRACT
  Filled 2015-08-13 (×2): qty 3

## 2015-08-13 MED ORDER — MIDAZOLAM HCL 5 MG/5ML IJ SOLN
INTRAMUSCULAR | Status: DC | PRN
  Administered 2015-08-13: 2 mg via INTRAVENOUS

## 2015-08-13 MED ORDER — POTASSIUM CHLORIDE 10 MEQ/50ML IV SOLN: 10.0000 meq | Freq: Every day | INTRAVENOUS | Status: DC | PRN

## 2015-08-13 MED ORDER — SUGAMMADEX SODIUM 200 MG/2ML IV SOLN
INTRAVENOUS | Status: AC
  Filled 2015-08-13: qty 2

## 2015-08-13 MED ORDER — ROCURONIUM BROMIDE 100 MG/10ML IV SOLN
INTRAVENOUS | Status: DC | PRN
  Administered 2015-08-13: 10 mg via INTRAVENOUS
  Administered 2015-08-13: 50 mg via INTRAVENOUS

## 2015-08-13 MED ORDER — MORPHINE SULFATE 2 MG/ML IV SOLN
INTRAVENOUS | Status: AC
  Filled 2015-08-13: qty 25

## 2015-08-13 SURGICAL SUPPLY — 82 items
ADH SKN CLS APL DERMABOND .7 (GAUZE/BANDAGES/DRESSINGS) ×2
APPLIER CLIP 5 13 M/L LIGAMAX5 (MISCELLANEOUS) ×4
APPLIER CLIP ROT 10 11.4 M/L (STAPLE)
APR CLP MED LRG 11.4X10 (STAPLE)
APR CLP MED LRG 5 ANG JAW (MISCELLANEOUS) ×2
BAG SPEC RTRVL LRG 6X4 10 (ENDOMECHANICALS)
CANISTER SUCTION 2500CC (MISCELLANEOUS) ×4 IMPLANT
CATH KIT ON Q 5IN SLV (PAIN MANAGEMENT) IMPLANT
CATH THORACIC 28FR (CATHETERS) IMPLANT
CATH THORACIC 28FR RT ANG (CATHETERS) IMPLANT
CATH THORACIC 36FR (CATHETERS) IMPLANT
CATH THORACIC 36FR RT ANG (CATHETERS) IMPLANT
CLIP APPLIE 5 13 M/L LIGAMAX5 (MISCELLANEOUS) IMPLANT
CLIP APPLIE ROT 10 11.4 M/L (STAPLE) IMPLANT
CLIP TI MEDIUM 6 (CLIP) IMPLANT
CONN Y 3/8X3/8X3/8  BEN (MISCELLANEOUS) ×2
CONN Y 3/8X3/8X3/8 BEN (MISCELLANEOUS) ×2 IMPLANT
CONT SPEC 4OZ CLIKSEAL STRL BL (MISCELLANEOUS) ×10 IMPLANT
COVER SURGICAL LIGHT HANDLE (MISCELLANEOUS) ×4 IMPLANT
DERMABOND ADVANCED (GAUZE/BANDAGES/DRESSINGS) ×2
DERMABOND ADVANCED .7 DNX12 (GAUZE/BANDAGES/DRESSINGS) IMPLANT
DRAIN CHANNEL 28F RND 3/8 FF (WOUND CARE) IMPLANT
DRAIN CHANNEL 32F RND 10.7 FF (WOUND CARE) IMPLANT
DRAPE LAPAROSCOPIC ABDOMINAL (DRAPES) ×4 IMPLANT
DRAPE WARM FLUID 44X44 (DRAPE) ×4 IMPLANT
ELECT BLADE 6.5 EXT (BLADE) ×2 IMPLANT
ELECT REM PT RETURN 9FT ADLT (ELECTROSURGICAL) ×4
ELECTRODE REM PT RTRN 9FT ADLT (ELECTROSURGICAL) ×2 IMPLANT
GAUZE SPONGE 4X4 12PLY STRL (GAUZE/BANDAGES/DRESSINGS) ×4 IMPLANT
GLOVE SURG SIGNA 7.5 PF LTX (GLOVE) ×8 IMPLANT
GOWN STRL REUS W/ TWL LRG LVL3 (GOWN DISPOSABLE) ×4 IMPLANT
GOWN STRL REUS W/ TWL XL LVL3 (GOWN DISPOSABLE) ×2 IMPLANT
GOWN STRL REUS W/TWL LRG LVL3 (GOWN DISPOSABLE) ×8
GOWN STRL REUS W/TWL XL LVL3 (GOWN DISPOSABLE) ×4
HEMOSTAT SURGICEL 2X14 (HEMOSTASIS) IMPLANT
KIT BASIN OR (CUSTOM PROCEDURE TRAY) ×4 IMPLANT
KIT ROOM TURNOVER OR (KITS) ×4 IMPLANT
KIT SUCTION CATH 14FR (SUCTIONS) ×4 IMPLANT
NS IRRIG 1000ML POUR BTL (IV SOLUTION) ×8 IMPLANT
PACK CHEST (CUSTOM PROCEDURE TRAY) ×4 IMPLANT
PAD ARMBOARD 7.5X6 YLW CONV (MISCELLANEOUS) ×8 IMPLANT
POUCH ENDO CATCH II 15MM (MISCELLANEOUS) IMPLANT
POUCH SPECIMEN RETRIEVAL 10MM (ENDOMECHANICALS) IMPLANT
RELOAD STAPLE 60 3.8 GOLD REG (STAPLE) IMPLANT
RELOAD STAPLER GOLD 60MM (STAPLE) ×16 IMPLANT
SEALANT PROGEL (MISCELLANEOUS) IMPLANT
SEALANT SURG COSEAL 4ML (VASCULAR PRODUCTS) IMPLANT
SEALANT SURG COSEAL 8ML (VASCULAR PRODUCTS) IMPLANT
SOLUTION ANTI FOG 6CC (MISCELLANEOUS) ×4 IMPLANT
SPECIMEN JAR MEDIUM (MISCELLANEOUS) ×4 IMPLANT
SPONGE GAUZE 4X4 12PLY STER LF (GAUZE/BANDAGES/DRESSINGS) ×2 IMPLANT
SPONGE INTESTINAL PEANUT (DISPOSABLE) IMPLANT
SPONGE TONSIL 1 RF SGL (DISPOSABLE) ×4 IMPLANT
STAPLE ECHEON FLEX 60 POW ENDO (STAPLE) ×2 IMPLANT
STAPLER RELOAD GOLD 60MM (STAPLE) ×32
SUT PROLENE 4 0 RB 1 (SUTURE)
SUT PROLENE 4-0 RB1 .5 CRCL 36 (SUTURE) IMPLANT
SUT SILK  1 MH (SUTURE) ×4
SUT SILK 1 MH (SUTURE) ×4 IMPLANT
SUT SILK 2 0SH CR/8 30 (SUTURE) IMPLANT
SUT SILK 3 0SH CR/8 30 (SUTURE) IMPLANT
SUT VIC AB 1 CTX 36 (SUTURE) ×8
SUT VIC AB 1 CTX36XBRD ANBCTR (SUTURE) IMPLANT
SUT VIC AB 2-0 CTX 36 (SUTURE) ×2 IMPLANT
SUT VIC AB 2-0 UR6 27 (SUTURE) IMPLANT
SUT VIC AB 3-0 MH 27 (SUTURE) IMPLANT
SUT VIC AB 3-0 X1 27 (SUTURE) ×4 IMPLANT
SUT VICRYL 2 TP 1 (SUTURE) IMPLANT
SWAB COLLECTION DEVICE MRSA (MISCELLANEOUS) IMPLANT
SYSTEM SAHARA CHEST DRAIN ATS (WOUND CARE) ×4 IMPLANT
TAPE CLOTH 4X10 WHT NS (GAUZE/BANDAGES/DRESSINGS) ×4 IMPLANT
TAPE CLOTH SURG 4X10 WHT LF (GAUZE/BANDAGES/DRESSINGS) ×2 IMPLANT
TIP APPLICATOR SPRAY EXTEND 16 (VASCULAR PRODUCTS) IMPLANT
TOWEL OR 17X24 6PK STRL BLUE (TOWEL DISPOSABLE) ×4 IMPLANT
TOWEL OR 17X26 10 PK STRL BLUE (TOWEL DISPOSABLE) ×8 IMPLANT
TRAP SPECIMEN MUCOUS 40CC (MISCELLANEOUS) IMPLANT
TRAY FOLEY CATH 16FRSI W/METER (SET/KITS/TRAYS/PACK) ×4 IMPLANT
TROCAR XCEL BLADELESS 5X75MML (TROCAR) ×4 IMPLANT
TROCAR XCEL NON-BLD 5MMX100MML (ENDOMECHANICALS) IMPLANT
TUBE ANAEROBIC SPECIMEN COL (MISCELLANEOUS) IMPLANT
TUNNELER SHEATH ON-Q 11GX8 DSP (PAIN MANAGEMENT) IMPLANT
WATER STERILE IRR 1000ML POUR (IV SOLUTION) ×8 IMPLANT

## 2015-08-13 NOTE — Care Management Note (Addendum)
Case Management Note Donn Pierini RN, BSN Unit 2W-Case Manager [phone]  Patient Details  Name: Victor [place] MRN: [id number] Date of Birth: 1976/05/20  Subjective/Objective:  Pt admitted with pneumothorax -chest tube placed- plan for VATS on 08/13/15                Action/Plan: PTA pt lived alone- has parents nearby that can assist- anticipate return home when medically stable- Does not have a PCP- may need appointment arranged at [facility]-  NCM to follow post op for d/c needs  Expected Discharge Date:                  Expected Discharge Plan:  Home/Self Care  In-House Referral:     Discharge planning Services  CM Consult  Post Acute Care Choice:    Choice offered to:     DME Arranged:    DME Agency:     HH Arranged:    HH Agency:     Status of Service:  In process, will continue to follow  Medicare Important Message Given:    Date Medicare IM Given:    Medicare IM give by:    Date Additional Medicare IM Given:    Additional Medicare Important Message give by:     If discussed at Long Length of Stay Meetings, dates discussed:    Additional Comments:  Darrold Span, RN 08/13/2015, 11:02 AM

## 2015-08-13 NOTE — H&P (View-Only) (Signed)
[address]       [name] [address]             [phone]        Procedure(s) (LRB): VIDEO ASSISTED THORACOSCOPY, plerual abrasion (Right) BLEBECTOMY (Right) Subjective: No new issues, breathing is comfortable, minor pain  Objective: Vital signs in last 24 hours: Temp:  [98.4 F (36.9 C)-99.3 F (37.4 C)] 98.4 F (36.9 C) (02/14 0518) Pulse Rate:  [53-70] 53 (02/14 0518) Cardiac Rhythm:  [-] Normal sinus rhythm (02/13 1900) Resp:  [15-21] 15 (02/14 0518) BP: (110-118)/(62-75) 112/69 mmHg (02/14 0518) SpO2:  [94 %-99 %] 97 % (02/14 0518) FiO2 (%):  [0.5 %] 0.5 % (02/13 1332)  Hemodynamic parameters for last 24 hours:    Intake/Output from previous day: 02/13 0701 - 02/14 0700 In: 720 [P.O.:720] Out: 1113 [Urine:1100; Chest Tube:13] Intake/Output this shift:    General appearance: alert, cooperative and no distress Heart: regular rate and rhythm Lungs: clear to auscultation bilaterally  Lab Results:  Recent Labs  08/10/15 1521  WBC 10.3  HGB 14.5  HCT 44.2  PLT 274   BMET:  Recent Labs  08/10/15 1521  NA 142  K 4.4  CL 103  CO2 25  GLUCOSE 92  BUN 12  CREATININE 0.93  CALCIUM 9.4    PT/INR:  Recent Labs  08/11/15 0240  LABPROT 14.0  INR 1.06   ABG No results found for: PHART, HCO3, TCO2, ACIDBASEDEF, O2SAT CBG (last 3)  No results for input(s): GLUCAP in the last 72 hours.  Meds Scheduled Meds: . enoxaparin (LOVENOX) injection  40 mg Subcutaneous Q24H  . morphine   Intravenous 6 times per day   Continuous Infusions: . sodium chloride 10 mL/hr at 08/12/15 0017   PRN Meds:.acetaminophen, alum & mag hydroxide-simeth, diphenhydrAMINE **OR** diphenhydrAMINE, magnesium hydroxide, naloxone **AND** sodium chloride flush, ondansetron (ZOFRAN) IV, zolpidem  Xrays Dg Chest 2 View  08/10/2015  CLINICAL DATA:  40 year old with prior history of spontaneous pneumothorax in 2006 and 2010, presenting with acute onset of  right-sided chest pain earlier today, similar to those episodes. EXAM: CHEST  2 VIEW COMPARISON:  2/20 07/1009 and earlier. FINDINGS: Apical, lateral, anterior and basilar right pneumothorax on the order of 25% or so. Small right pleural effusion demonstrating an air-fluid level. Blebs in both lung apices. Small nodular opacity projected over the right mid lung on the PA image, not clearly localized on the lateral image and not visible on prior examinations. Lungs otherwise clear. No left pleural effusion. Cardiomediastinal silhouette unremarkable, unchanged. Visualized bony thorax intact. IMPRESSION: 1. Spontaneous right hydropneumothorax on the order of 25% or so. 2. Blebs involving the lung apices, indicating age advanced emphysema. 3. Possible subcentimeter nodule in the right mid lung on the PA image, not visible on the lateral and not visible on prior examinations. CT of the chest with contrast may be helpful in further evaluation after treatment of the pneumothorax. Electronically Signed   By: Hulan Saas M.D.   On: 08/10/2015 15:49   Ct Chest Wo Contrast  08/10/2015  CLINICAL DATA:  40 year old male with spontaneous pneumothorax and right-sided chest tube. EXAM: CT CHEST WITHOUT CONTRAST TECHNIQUE: Multidetector CT imaging of the chest was performed following the standard protocol without IV contrast. COMPARISON:  Radiograph dated 08/10/2015 FINDINGS: Evaluation of this exam is limited in the absence of intravenous contrast. The right-sided chest tube is noted entering from the right anterior chest wall between the fourth and  fifth ribs with tip in the right anterior apical region. Minimal pneumothorax noted along the right anterior pleural surface as well as along the right hemidiaphragm. There are biapical subpleural blebs measuring up to 4.3 cm on the left. The lungs are otherwise clear. There is no pleural effusion. The central airways are patent. The thoracic aorta and central pulmonary arteries  are grossly unremarkable on this noncontrast study. There is no cardiomegaly or pericardial effusion. No hilar or mediastinal adenopathy. The esophagus and the thyroid gland appear unremarkable. There is no axillary adenopathy. Small pockets of air noted adjacent to the chest tube in the right anterior chest wall. The chest wall soft tissues are otherwise unremarkable. The osseous structures are intact. The visualized upper abdomen is grossly unremarkable. IMPRESSION: Right-sided chest tube with tip in the right apical region. Minimal pneumothorax noted along the right anterior pleural surface as well as along the right hemidiaphragm. Biapical subpleural blebs. Electronically Signed   By: Elgie Collard M.D.   On: 08/10/2015 23:39   Dg Chest 1v Repeat Same Day  08/10/2015  CLINICAL DATA:  Status post chest tube placement EXAM: CHEST - 1 VIEW SAME DAY COMPARISON:  Film from earlier in the same day FINDINGS: Right-sided chest tube is now seen with the tip overlying the right lung apex. The previously seen pneumothorax has been resolved. Left lung remains clear. The cardiac shadow is stable. No bony abnormality is seen. IMPRESSION: Resolution of right-sided pneumothorax following chest tube placement. Electronically Signed   By: Alcide Clever M.D.   On: 08/10/2015 18:16   Dg Chest Port 1 View  08/12/2015  CLINICAL DATA:  History of pneumothorax EXAM: PORTABLE CHEST 1 VIEW COMPARISON:  08/11/2015 FINDINGS: Cardiac shadow is within normal limits. The lungs are well aerated bilaterally. No focal infiltrate or sizable effusion is seen. No pneumothorax is noted. Right chest tube is again identified. Minimal scarring is noted in the apices bilaterally. IMPRESSION: No change from the prior exam.  No recurrent pneumothorax seen. Electronically Signed   By: Alcide Clever M.D.   On: 08/12/2015 08:16   Dg Chest Port 1 View  08/11/2015  CLINICAL DATA:  Right chest tube. EXAM: PORTABLE CHEST 1 VIEW COMPARISON:  CT chest  and chest radiographs 08/10/2015. FINDINGS: Trachea is midline. Heart size normal. Right chest tube terminates at the apex of the right hemi thorax. Bullous changes at the apices. No definite pneumothorax. Lungs are otherwise clear. No pleural fluid. IMPRESSION: 1. Right chest tube in place without pneumothorax. 2. Bullous changes at the apices. Electronically Signed   By: Leanna Battles M.D.   On: 08/11/2015 08:45    Assessment/Plan: S/P Procedure(s) (LRB): VIDEO ASSISTED THORACOSCOPY, plerual abrasion (Right) BLEBECTOMY (Right) 1 CXR is stable, no air leak, plan for VATS tomorrow- cont current rx/tx   LOS: 2 days    Marcellene Shivley E 08/12/2015  Patient seen and examined, agree with above  I recommended he undergo surgical correction for a recurrent right spontaneous pneumothorax. I informed him of the general nature of the procedure, the need for general anesthesia, the incisions to be used, the success rate(98%), the hospital stay and overall recovery. I reviewed the indications, risks, benefits and alternatives. He understands the risks include but are not limited to death (<1%), DVT, PE, bleeding, possible need for transfusion, infection, prolonged air leak, recurrent pneumothorax, as well as the possibility of other unforeseeable complications.  He agrees to proceed. For OR tomorrow.  Salvatore Decent Dorris Fetch, MD [facility] (  [phone]  

## 2015-08-13 NOTE — Transfer of Care (Signed)
Immediate Anesthesia Transfer of Care Note  Patient: Victor [place]  Procedure(s) Performed: Procedure(s): VIDEO ASSISTED THORACOSCOPY, plerual abrasion (Right) BLEBECTOMY (Right)  Patient Location: PACU  Anesthesia Type:General  Level of Consciousness: awake, alert , oriented, patient cooperative and responds to stimulation  Airway & Oxygen Therapy: Patient Spontanous Breathing and Patient connected to nasal cannula oxygen  Post-op Assessment: Report given to RN, Post -op Vital signs reviewed and stable and Patient moving all extremities X 4  Post vital signs: Reviewed and stable  Last Vitals:  Filed Vitals:   08/13/15 0650 08/13/15 0800  BP: 107/62   Pulse: 54   Temp: 36.4 C   Resp: 13 15    Complications: No apparent anesthesia complications

## 2015-08-13 NOTE — Brief Op Note (Addendum)
08/10/2015 - 08/13/2015  1:55 PM  PATIENT:  Victor Jefferson  40 y.o. male  PRE-OPERATIVE DIAGNOSIS:  RECURRENT RIGHT SPONTANEOUS PNEUMOTHORAX  POST-OPERATIVE DIAGNOSIS:  RECURRENT RIGHT SPONTANEOUS PNEUMOTHORAX  PROCEDURE:  Procedure(s):  VIDEO ASSISTED THORACOSCOPY -Resection Blebs, RUL -Mechanical Pleurodesis  SURGEON:  Surgeon(s) and Role:    * Loreli Slot, MD - Primary  PHYSICIAN ASSISTANT: Lowella Dandy PA-C  ANESTHESIA:   general  EBL:  Total I/O In: 900 [I.V.:900] Out: 450 [Urine:400; Blood:50]  BLOOD ADMINISTERED:none  DRAINS: 28 Straight Chest Tube   LOCAL MEDICATIONS USED:  NONE  SPECIMEN:  Source of Specimen:  Blebs RUL  DISPOSITION OF SPECIMEN:  PATHOLOGY  COUNTS:  YES  PLAN OF CARE: Admit to inpatient   PATIENT DISPOSITION:  PACU - hemodynamically stable.   Delay start of Pharmacological VTE agent (>24hrs) due to surgical blood loss or risk of bleeding: yes

## 2015-08-13 NOTE — Interval H&P Note (Signed)
History and Physical Interval Note:  08/13/2015 12:02 PM  Victor Jefferson  has presented today for surgery, with the diagnosis of RIGHT PNEUMOTHORAX  The various methods of treatment have been discussed with the patient and family. After consideration of risks, benefits and other options for treatment, the patient has consented to  Procedure(s): VIDEO ASSISTED THORACOSCOPY, plerual abrasion (Right) BLEBECTOMY (Right) as a surgical intervention .  The patient's history has been reviewed, patient examined, no change in status, stable for surgery.  I have reviewed the patient's chart and labs.  Questions were answered to the patient's satisfaction.     Loreli Slot

## 2015-08-13 NOTE — Anesthesia Procedure Notes (Signed)
Procedure Name: Intubation Date/Time: 08/13/2015 12:50 PM Performed by: Virgel Gess LEFFEW Pre-anesthesia Checklist: Patient identified, Patient being monitored, Timeout performed, Emergency Drugs available and Suction available Patient Re-evaluated:Patient Re-evaluated prior to inductionOxygen Delivery Method: Circle System Utilized Preoxygenation: Pre-oxygenation with 100% oxygen Intubation Type: IV induction Ventilation: Mask ventilation without difficulty Laryngoscope Size: Mac and 3 Grade View: Grade I Tube type: Oral Endobronchial tube: Double lumen EBT and 39 Fr Number of attempts: 1 Airway Equipment and Method: Stylet Placement Confirmation: ETT inserted through vocal cords under direct vision,  positive ETCO2 and breath sounds checked- equal and bilateral Tube secured with: Tape Dental Injury: Teeth and Oropharynx as per pre-operative assessment

## 2015-08-13 NOTE — Op Note (Signed)
NAMEGERRICK, Jefferson                 ACCOUNT NO.:  [id number]  MEDICAL RECORD NO.:  [id number]  LOCATION:  2W29C                        FACILITY:  [facility]  PHYSICIAN:  Salvatore Decent. Dorris Fetch, M.D.DATE OF BIRTH:  May 14, 1976  DATE OF PROCEDURE:  08/13/2015 DATE OF DISCHARGE:                              OPERATIVE REPORT   PREOPERATIVE DIAGNOSIS:  Recurrent right spontaneous pneumothorax.  POSTOPERATIVE DIAGNOSIS:  Recurrent right spontaneous pneumothorax.  PROCEDURE:   Right video-assisted thoracoscopy Resection of blebs from right upper lobe Mechanical pleurodesis.  SURGEON:  Salvatore Decent. Dorris Fetch, M.D.  FIRST ASSISTANT:  Erin Barrett, PA.  ANESTHESIA:  General.  FINDINGS:  Multiple blebs with a complex of blebs at the apex. Additional blebs anteriorly and laterally and along the inferior margin of the right upper lobe.  CLINICAL NOTE:  [name] is a 40 year old gentleman who has a history of prior bilateral pneumothoraces.  He presented this admission with chest pain and shortness of breath and had a recurrent right spontaneous pneumothorax.  CT scan showed complex bleb disease in the right upper lobe.  He was advised to undergo surgical resection of the blebs and pleural abrasion to promote pleurodesis to prevent further recurrent spontaneous pneumothoraces.  The indications, risks, benefits, and alternatives were discussed in detail with the patient.  He understood and accepted the risks and agreed to proceed.  OPERATIVE NOTE:  Victor Jefferson was brought to the operating room on August 13, 2015.  Anesthesia established intravenous access and an arterial blood pressure monitoring line.  He was taken to the operating room, anesthetized, and intubated with a double-lumen endotracheal tube. Sequential compressive devices were placed on the calves for DVT prophylaxis.  A Foley catheter was placed.  He was placed in a left lateral decubitus position, and the right chest was prepped  and draped in usual sterile fashion.  Single lung ventilation of the left lung was initiated and was tolerated well throughout the procedure.  The right chest tube was removed.  The right chest was prepped and draped in usual sterile fashion.  A 5-mm port was placed through the patient's previous chest tube site, and the thoracoscope was advanced into the chest.  There was good isolation of the right lung.  There were multiple blebs in the apex and adhesions to the parietal pleura.  These adhesions were taken down.  One of the adhesions was rather large and vascular, it was clipped and divided. The remainder were divided with electrocautery.  There were also adhesions from a separate bleb anteriorly which were taken down as well. There was a large complex of the blebs at the apex.  This area was resected with sequential firings of endoscopic GIA stapler.  An 60 mm Ethicon echelon powered stapler with gold cartridges was used, 3 cartridges were needed for this site.  The specimen was removed and sent for permanent pathology.  There was a large distended bleb anteriorly that was previously mentioned which had adhesions to the anterior mediastinum. This bleb was resected separately again using the endoscopic stapler. There were 2 blebs on the lateral aspect of the upper lobe that ran towards the apex, these were too far separated to take  together, they were removed separately with the stapler and there were multiple small blebs along the inferior margin of the right upper lobe which were removed as well.  The superior segment of the lower lobe was inspected and there were no blebs visible in that area.  The right lung was reinflated and there were no additional visible blebs.  The pleura then was lightly abraded to promote adhesion formation.  A 28-French chest tube was placed through the original chest tube incision and secured with #1 silk suture.  The right lung was reinflated.  The  working incision was closed with a running #1 Vicryl fascial suture, a 2-0 Vicryl subcutaneous suture, and a 3-0 Vicryl subcuticular suture.  All sponge, needle, and instrument counts were correct at the end of the procedure.  The patient was extubated in the operating room and taken to the postanesthetic care unit in good condition.     Salvatore Decent Dorris Fetch, M.D.     SCH/MEDQ  D:  08/13/2015  T:  08/13/2015  Job:  [id number]

## 2015-08-13 NOTE — Progress Notes (Signed)
Dayshift RN paged Dr.Gerhardt related to intense pain with foley and pt wants it removed. MD paged back and said ok to remove now. RN will remove foley and continue to monitor.  Roselie Awkward, RN

## 2015-08-13 NOTE — Anesthesia Preprocedure Evaluation (Addendum)
Anesthesia Evaluation  Patient identified by MRN, date of birth, ID band Patient awake    Reviewed: Allergy & Precautions, NPO status , Patient's Chart, lab work & pertinent test results  History of Anesthesia Complications Negative for: history of anesthetic complications  Airway Mallampati: II  TM Distance: >3 FB Neck ROM: Full    Dental  (+) Teeth Intact   Pulmonary neg shortness of breath, neg sleep apnea, neg COPD, neg recent URI, Current Smoker,    breath sounds clear to auscultation       Cardiovascular negative cardio ROS   Rhythm:Regular     Neuro/Psych negative neurological ROS  negative psych ROS   GI/Hepatic negative GI ROS, Neg liver ROS,   Endo/Other  negative endocrine ROS  Renal/GU negative Renal ROS     Musculoskeletal   Abdominal   Peds  Hematology negative hematology ROS (+)   Anesthesia Other Findings   Reproductive/Obstetrics                            Anesthesia Physical Anesthesia Plan  ASA: I  Anesthesia Plan: General   Post-op Pain Management:    Induction: Intravenous  Airway Management Planned: Double Lumen EBT  Additional Equipment: Arterial line  Intra-op Plan:   Post-operative Plan: Extubation in OR  Informed Consent: I have reviewed the patients History and Physical, chart, labs and discussed the procedure including the risks, benefits and alternatives for the proposed anesthesia with the patient or authorized representative who has indicated his/her understanding and acceptance.   Dental advisory given  Plan Discussed with: CRNA and Surgeon  Anesthesia Plan Comments:         Anesthesia Quick Evaluation

## 2015-08-14 ENCOUNTER — Inpatient Hospital Stay (HOSPITAL_COMMUNITY): Payer: Self-pay

## 2015-08-14 ENCOUNTER — Encounter (HOSPITAL_COMMUNITY): Payer: Self-pay | Admitting: Thoracic Surgery (Cardiothoracic Vascular Surgery)

## 2015-08-14 LAB — BASIC METABOLIC PANEL
Anion gap: 8 (ref 5–15)
BUN: 7 mg/dL (ref 6–20)
CHLORIDE: 105 mmol/L (ref 101–111)
CO2: 27 mmol/L (ref 22–32)
CREATININE: 0.83 mg/dL (ref 0.61–1.24)
Calcium: 8.8 mg/dL — ABNORMAL LOW (ref 8.9–10.3)
GFR calc Af Amer: >60 mL/min (ref >60)
GFR calc non Af Amer: >60 mL/min (ref >60)
GLUCOSE: 137 mg/dL — AB (ref 65–99)
POTASSIUM: 4.1 mmol/L (ref 3.5–5.1)
SODIUM: 140 mmol/L (ref 135–145)

## 2015-08-14 LAB — CBC
HEMATOCRIT: 39.5 % (ref 39.0–52.0)
HEMOGLOBIN: 13.2 g/dL (ref 13.0–17.0)
MCH: 31.6 pg (ref 26.0–34.0)
MCHC: 33.4 g/dL (ref 30.0–36.0)
MCV: 94.5 fL (ref 78.0–100.0)
Platelets: 220 10*3/uL (ref 150–400)
RBC: 4.18 MIL/uL — AB (ref 4.22–5.81)
RDW: 12.3 % (ref 11.5–15.5)
WBC: 11.6 10*3/uL — ABNORMAL HIGH (ref 4.0–10.5)

## 2015-08-14 MED ORDER — LEVALBUTEROL HCL 0.63 MG/3ML IN NEBU: 0.6300 mg | INHALATION_SOLUTION | Freq: Four times a day (QID) | RESPIRATORY_TRACT | Status: DC | PRN

## 2015-08-14 MED ORDER — ACETAMINOPHEN 325 MG PO TABS
650.0000 mg | ORAL_TABLET | Freq: Four times a day (QID) | ORAL | Status: DC | PRN
  Administered 2015-08-14 – 2015-08-15 (×2): 650 mg via ORAL
  Filled 2015-08-14 (×2): qty 2

## 2015-08-14 NOTE — Discharge Summary (Signed)
Physician Discharge Summary  Patient ID: Victor Jefferson MRN: [id number] DOB/AGE: 09-01-1975 40 y.o.  Admit date: 08/10/2015 Discharge date: 08/17/2015  Admission Diagnoses: Spontaneous right pneumothorax secondary to pulmonary blebs  Discharge Diagnoses:  Active Problems:   Pneumothorax on right   S/P lung surgery, follow-up exam  Patient Active Problem List   Diagnosis Date Noted  . S/P lung surgery, follow-up exam 08/13/2015  . Pneumothorax on right 08/10/2015    HPI: at time of admission  40 yo man with a history of bilateral spontaneous pneumothoraces, most recently on the right side in 2011. Presented to ED today with a cc/o sudden onset of right sided chest pain and shortness of breath. + pain with inspiration. SOB has eased off since arrival. He chest tube was placed in the emergency department by Dr.  Dorris Fetch and he was admitted for further evaluation and treatment.   Discharged Condition: good  Hospital Coursepatient was admitted to the emergency department after a chest tube was placed. A CT of the chest was obtained on 08/10/2015. Bilateral pulmonary blebs were noted. As this was recurrent was felt that the patient would benefit from the assisted thoracoscopy as a more definitive treatment at this time. The below described procedure was performed by Dr. Dorris Fetch. He tolerated it well and was taken to the postanesthesia care unit in stable condition. Postoperatively the patient has shown steady progress.  His chest tube did not exhibit evidence of an air leak and was placed to water seal.  CXR remained stable with a right sided tiny apical pneumothorax.  His chest tube was removed on POD #2.  However, follow up CXR showed increase in the right apical pneumothorax, as well as development of a right sided basilar pneumothorax.  He was placed on supplemental oxygen and repeat CXR later that day showed pneumothorax to remain stable in appearance.  He remained on observation for  another 24 hours.  Repeat CXR was again obtained and showed stable appearance of his pneumothorax.  He was maintaining good oxygen saturations on room air.  His pain was controlled.  He was felt medically stable for discharge.   Consults: None  Significant Diagnostic Studies: radiology: CT chest, serial CXR  Treatments: surgery:   DATE OF PROCEDURE: 08/13/2015 DATE OF DISCHARGE:   OPERATIVE REPORT   PREOPERATIVE DIAGNOSIS: Recurrent right spontaneous pneumothorax.  POSTOPERATIVE DIAGNOSIS: Recurrent right spontaneous pneumothorax.  PROCEDURE: Right video-assisted thoracoscopy, resection of blebs from right upper lobe, mechanical pleurodesis.  SURGEON: Salvatore Decent. Dorris Fetch, M.D.  FIRST ASSISTANT: Erin Barrett, PA.  ANESTHESIA: General.  FINDINGS: Multiple blebs with a complex of blebs at the apex. Additional blebs anteriorly and laterally and along the inferior margin of the right upper lobe.    Also:08/10/15 at time of admission through ER    Procedures   CHEST TUBE INSERTION [MWN027 (Custom)]      Expand All Collapse All   Informed consent obtained Given 4 mg morphine and 2 mg versed Time out performed 28 F chest tube placed using 15 ml 1% lidocaine for local anesthesia +rush of air Tolerated well CT to suction  Viviann Spare C. Dorris Fetch, MD [facility] [phone]      Disposition: 01-Home or Self Care      Medication List    TAKE these medications        oxyCODONE 5 MG immediate release tablet  Commonly known as:  Oxy IR/ROXICODONE  Take 1-2 tablets (5-10 mg total) by mouth every 4 (four) hours as  needed for severe pain.       Follow-up Information    Follow up with Loreli Slot, MD On 08/26/2015.   Specialty:  Cardiothoracic Surgery   Why:  Appointment is at 1:45, please get CXR at 1:15   Contact information:   [address] [place]  [address] [phone]       Signed: Lowella Dandy 08/17/2015, 12:02 PM

## 2015-08-14 NOTE — Progress Notes (Addendum)
      [address]       [name] [address]             [phone]       1 Day Post-Op Procedure(s) (LRB): VIDEO ASSISTED THORACOSCOPY, plerual abrasion (Right) BLEBECTOMY (Right)  Subjective: Patient wants diet advanced.  Objective: Vital signs in last 24 hours: Temp:  [97.5 F (36.4 C)-98.4 F (36.9 C)] 98.4 F (36.9 C) (02/16 0518) Pulse Rate:  [61-83] 67 (02/16 0518) Cardiac Rhythm:  [-] Normal sinus rhythm (02/15 1954) Resp:  [15-25] 18 (02/16 0518) BP: (108-152)/(67-94) 108/67 mmHg (02/16 0518) SpO2:  [94 %-100 %] 98 % (02/16 0518) Arterial Line BP: (143-149)/(73-82) 145/75 mmHg (02/15 1517) FiO2 (%):  [28 %] 28 % (02/15 0800)     Intake/Output from previous day: 02/15 0701 - 02/16 0700 In: 1380 [P.O.:480; I.V.:900] Out: 2136 [Urine:1975; Blood:50; Chest Tube:111]   Physical Exam:  Cardiovascular: RRR Pulmonary: Mostly clear Abdomen: Soft, non tender, bowel sounds present. Extremities: No lower extremity edema. Wounds: Clean and dry.  No erythema or signs of infection. Chest Tube: to suction, +2 air leak with cough  Lab Results: CBC:  Recent Labs  08/14/15 0350  WBC 11.6*  HGB 13.2  HCT 39.5  PLT 220   BMET:   Recent Labs  08/14/15 0350  NA 140  K 4.1  CL 105  CO2 27  GLUCOSE 137*  BUN 7  CREATININE 0.83  CALCIUM 8.8*    PT/INR: No results for input(s): LABPROT, INR in the last 72 hours. ABG:  INR: Will add last result for INR, ABG once components are confirmed Will add last 4 CBG results once components are confirmed  Assessment/Plan:  1. CV - SR 2.  Pulmonary - Chest tube with 111 cc of output since surgery. Chest tube is to suction and there is a +2 air leak with cough. CXR shows stable, small pneumothorax on the right, bullae in apices. Hope to place chest tube to water seal soon. Check CXR in am. 3. Advance diet 4. Decrease IVF  ZIMMERMAN,DONIELLE MPA-C 08/14/2015,7:58 AM  Patient seen and examined,  agree with above except I don't see any air leak Will place CT to water seal and recheck CXR in AM  Gurney Balthazor C. Dorris Fetch, MD [facility] [phone]

## 2015-08-14 NOTE — Discharge Instructions (Signed)
Thoracoscopy, Care After Refer to this sheet in the next few weeks. These instructions provide you with information about caring for yourself after your procedure. Your health care provider may also give you more specific instructions. Your treatment has been planned according to current medical practices, but problems sometimes occur. Call your health care provider if you have any problems or questions after your procedure. WHAT TO EXPECT AFTER THE PROCEDURE: After your procedure, it is common to feel sore for up to two weeks. HOME CARE INSTRUCTIONS  There are many different ways to close and cover an incision, including stitches (sutures), skin glue, and adhesive strips. Follow your health care provider's instructions about:  Incision care.  Bandage (dressing) changes and removal.  Incision closure removal.  Check your incision area every day for signs of infection. Watch for:  Redness, swelling, or pain.  Fluid, blood, or pus.  Take medicines only as directed by your health care provider.  Try to cough often. Coughing helps to protect against lung infection (pneumonia). It may hurt to cough. If this happens, hold a pillow against your chest when you cough.  Take deep breaths. This also helps to protect against pneumonia.  If you were given an incentive spirometer, use it as directed by your health care provider.  Do not take baths, swim, or use a hot tub until your health care provider approves. You may take showers.  Avoid lifting until your health care provider approves.  Avoid driving until your health care provider approves.  Do not travel by airplane after the chest tube is removed until your health care provider approves. SEEK MEDICAL CARE IF:  You have a fever.  Pain medicines do not ease your pain.  You have redness, swelling, or increasing pain in your incision area.  You develop a cough that does not go away, or you are coughing up mucus that is yellow or  green. SEEK IMMEDIATE MEDICAL CARE IF:  You have fluid, blood, or pus coming from your incision.  There is a bad smell coming from your incision or dressing.  You develop a rash.  You have difficulty breathing.  You cough up blood.  You develop light-headedness or you feel faint.  You develop chest pain.  Your heartbeat feels irregular or very fast.   This information is not intended to replace advice given to you by your health care provider. Make sure you discuss any questions you have with your health care provider.   Document Released: 01/01/2005 Document Revised: 07/05/2014 Document Reviewed: 02/27/2014 Elsevier Interactive Patient Education 2016 [employer]. Pneumothorax A pneumothorax, commonly called a collapsed lung, is a condition in which air leaks from a lung and builds up in the space between the lung and the chest wall (pleural space). The air in a pneumothorax is trapped outside the lung and takes up space, preventing the lung from fully expanding. This is a condition that usually occurs suddenly. The buildup of air may be small or large. A small pneumothorax may go away on its own. When a pneumothorax is larger, it will often require medical treatment and hospitalization.  CAUSES  A pneumothorax can sometimes happen quickly with no apparent cause. People with underlying lung problems, particularly COPD or emphysema, are at higher risk of pneumothorax. However, pneumothorax can happen quickly even in people with no prior known lung problems. Trauma, surgery, medical procedures, or injury to the chest wall can also cause a pneumothorax. SIGNS AND SYMPTOMS  Sometimes a pneumothorax will have no  symptoms. When symptoms are present, they can include:  Chest pain.  Shortness of breath.  Increased rate of breathing.  Bluish color to your lips or skin (cyanosis). DIAGNOSIS  Pneumothorax is usually diagnosed by a chest X-ray or chest CT scan. Your health care provider  will also take a medical history and perform a physical exam to determine why you may have a pneumothorax. TREATMENT  A small pneumothorax may go away on its own without treatment. Extra oxygen can sometimes help a small pneumothorax go away more quickly. For a larger pneumothorax or a pneumothorax that is causing symptoms, a procedure is usually needed to drain the air.In some cases, the health care provider may drain the air using a needle. In other cases, a chest tube may be inserted into the pleural space. A chest tube is a small tube placed between the ribs and into the pleural space. This removes the extra air and allows the lung to expand back to its normal size. A large pneumothorax will usually require a hospital stay. If there is ongoing air leakage into the pleural space, then the chest tube may need to remain in place for several days until the air leak has healed. In some cases, surgery may be needed.  HOME CARE INSTRUCTIONS   Only take over-the-counter or prescription medicines as directed by your health care provider.  If a cough or pain makes it difficult for you to sleep at night, try sleeping in a semi-upright position in a recliner or by using 2 or 3 pillows.  Rest and limit activity as directed by your health care provider.  If you had a chest tube and it was removed, ask your health care provider when it is okay to remove the dressing. Until your health care provider says you can remove the dressing, do not allow it to get wet.  Do not smoke. Smoking is a risk factor for pneumothorax.  Do not fly in an airplane or scuba dive until your health care provider says it is okay.  Follow up with your health care provider as directed. SEEK IMMEDIATE MEDICAL CARE IF:   You have increasing chest pain or shortness of breath.  You have a cough that is not controlled with suppressants.  You begin coughing up blood.  You have pain that is getting worse or is not controlled with  medicines.  You cough up thick, discolored mucus (sputum) that is yellow to green in color.  You have redness, increasing pain, or discharge at the site where a chest tube had been in place (if your pneumothorax was treated with a chest tube).  The site where your chest tube was located opens up.  You feel air coming out of the site where the chest tube was placed.  You have a fever or persistent symptoms for more than 2-3 days.  You have a fever and your symptoms suddenly get worse. MAKE SURE YOU:   Understand these instructions.  Will watch your condition.  Will get help right away if you are not doing well or get worse.   This information is not intended to replace advice given to you by your health care provider. Make sure you discuss any questions you have with your health care provider.   Document Released: 06/14/2005 Document Revised: 04/04/2013 Document Reviewed: 01/11/2013 Elsevier Interactive Patient Education [employer]

## 2015-08-14 NOTE — Progress Notes (Signed)
Utilization review completed.  

## 2015-08-15 ENCOUNTER — Inpatient Hospital Stay (HOSPITAL_COMMUNITY): Payer: Self-pay

## 2015-08-15 LAB — CBC
HEMATOCRIT: 40.6 % (ref 39.0–52.0)
HEMOGLOBIN: 13.8 g/dL (ref 13.0–17.0)
MCH: 32.4 pg (ref 26.0–34.0)
MCHC: 34 g/dL (ref 30.0–36.0)
MCV: 95.3 fL (ref 78.0–100.0)
Platelets: 216 10*3/uL (ref 150–400)
RBC: 4.26 MIL/uL (ref 4.22–5.81)
RDW: 12.7 % (ref 11.5–15.5)
WBC: 10.8 10*3/uL — AB (ref 4.0–10.5)

## 2015-08-15 NOTE — Progress Notes (Addendum)
      [address]       [name] [address]             [phone]       2 Days Post-Op Procedure(s) (LRB): VIDEO ASSISTED THORACOSCOPY, plerual abrasion (Right) BLEBECTOMY (Right)  Subjective: Patient without complaints  Objective: Vital signs in last 24 hours: Temp:  [98.3 F (36.8 C)-99.1 F (37.3 C)] 98.8 F (37.1 C) (02/17 0423) Pulse Rate:  [70-80] 78 (02/17 0423) Cardiac Rhythm:  [-] Normal sinus rhythm (02/17 0758) Resp:  [16-18] 16 (02/17 0423) BP: (115-122)/(70-74) 117/71 mmHg (02/17 0423) SpO2:  [96 %-98 %] 97 % (02/17 0423)     Intake/Output from previous day: 02/16 0701 - 02/17 0700 In: 752.7 [P.O.:240; I.V.:512.7] Out: 1088 [Urine:950; Chest Tube:138]   Physical Exam:  Cardiovascular: RRR Pulmonary: Mostly clear Abdomen: Soft, non tender, bowel sounds present. Extremities: No lower extremity edema. Wounds: Clean and dry.  No erythema or signs of infection. Chest Tube: to water seal, tidling with cough but no air leak  Lab Results: CBC:  Recent Labs  08/14/15 0350 08/15/15 0400  WBC 11.6* 10.8*  HGB 13.2 13.8  HCT 39.5 40.6  PLT 220 216   BMET:   Recent Labs  08/14/15 0350  NA 140  K 4.1  CL 105  CO2 27  GLUCOSE 137*  BUN 7  CREATININE 0.83  CALCIUM 8.8*    PT/INR: No results for input(s): LABPROT, INR in the last 72 hours. ABG:  INR: Will add last result for INR, ABG once components are confirmed Will add last 4 CBG results once components are confirmed  Assessment/Plan:  1. CV - SR 2.  Pulmonary - Chest tube with 138 cc of output since surgery. Chest tube is to water seal and there is some tidling with cough but no true air leak. CXR shows stable, small pneumothorax on the right, bullae in apices. Hope to remove chest tube soon. Check CXR in am. 3. H and H stable at 13.8 and 40.6  ZIMMERMAN,DONIELLE MPA-C 08/15/2015,8:50 AM   Doing well agree with above There is a small apical space and tidal  movement in the CT tubing- dc CT Possibly home in AM  [place] C. Dorris Fetch, MD [facility] [phone]

## 2015-08-15 NOTE — Progress Notes (Signed)
CT d/c'd per order and per protocol. Pt tolerated procedure well. Call bell and phone within reach. Will continue to monitor.

## 2015-08-15 NOTE — Anesthesia Postprocedure Evaluation (Signed)
Anesthesia Post Note  Patient: Victor Jefferson  Procedure(s) Performed: Procedure(s) (LRB): VIDEO ASSISTED THORACOSCOPY, plerual abrasion (Right) BLEBECTOMY (Right)  Patient location during evaluation: PACU Level of consciousness: awake Pain management: pain level controlled Vital Signs Assessment: post-procedure vital signs reviewed and stable Respiratory status: spontaneous breathing Cardiovascular status: stable Postop Assessment: no signs of nausea or vomiting Anesthetic complications: no    Last Vitals:  Filed Vitals:   08/14/15 1950 08/15/15 0423  BP: 115/70 117/71  Pulse: 75 78  Temp: 36.8 C 37.1 C  Resp: 18 16    Last Pain:  Filed Vitals:   08/15/15 1116  PainSc: 2                  Halynn Reitano

## 2015-08-16 ENCOUNTER — Inpatient Hospital Stay (HOSPITAL_COMMUNITY): Payer: Self-pay

## 2015-08-16 NOTE — Progress Notes (Addendum)
      [address]       Victor [place] [address]             [phone]      3 Days Post-Op Procedure(s) (LRB): VIDEO ASSISTED THORACOSCOPY, plerual abrasion (Right) BLEBECTOMY (Right)   Subjective:  Victor Jefferson has increased pain along his right side this morning.  Especially along the lower portion of his rib cage.  He has some mild shortness of breath.  Objective: Vital signs in last 24 hours: Temp:  [98 F (36.7 C)-98.3 F (36.8 C)] 98.3 F (36.8 C) (02/18 0456) Pulse Rate:  [63-71] 63 (02/18 0456) Cardiac Rhythm:  [-] Normal sinus rhythm (02/18 0731) Resp:  [18-20] 18 (02/18 0456) BP: (106-113)/(56-73) 106/73 mmHg (02/18 0456) SpO2:  [98 %-99 %] 99 % (02/18 0456)  Intake/Output from previous day: 02/17 0701 - 02/18 0700 In: 480 [P.O.:480] Out: 1035 [Urine:975; Chest Tube:60]  General appearance: alert, cooperative and no distress Heart: regular rate and rhythm Lungs: diminished breath sounds RUL Abdomen: soft, non-tender; bowel sounds normal; no masses,  no organomegaly Wound: clean and ry  Lab Results:  Recent Labs  08/14/15 0350 08/15/15 0400  WBC 11.6* 10.8*  HGB 13.2 13.8  HCT 39.5 40.6  PLT 220 216   BMET:  Recent Labs  08/14/15 0350  NA 140  K 4.1  CL 105  CO2 27  GLUCOSE 137*  BUN 7  CREATININE 0.83  CALCIUM 8.8*    PT/INR: No results for input(s): LABPROT, INR in the last 72 hours. ABG No results found for: PHART, HCO3, TCO2, ACIDBASEDEF, O2SAT CBG (last 3)  No results for input(s): GLUCAP in the last 72 hours.  Assessment/Plan: S/P Procedure(s) (LRB): VIDEO ASSISTED THORACOSCOPY, plerual abrasion (Right) BLEBECTOMY (Right)  1. Pneumothorax- chest tube removed yesterday, now with increase is apical pneumothorax, and now a basilar component has presented... Patient is symptomatic 2. Pulm- placed patient back on oxygen, will repeat CXR at 1500 3. Dispo- patient S/P chest tube removal yesterday, now with worsening  pneumothorax, will repeat CXR, discuss with staff   LOS: 6 days    Victor Jefferson, Victor Jefferson 08/16/2015  patient examined and medical record reviewed,agree with above note. Victor Jefferson 08/17/2015

## 2015-08-17 ENCOUNTER — Inpatient Hospital Stay (HOSPITAL_COMMUNITY): Payer: Self-pay

## 2015-08-17 MED ORDER — OXYCODONE HCL 5 MG PO TABS: 5.0000 mg | ORAL_TABLET | ORAL | Status: DC | PRN

## 2015-08-17 NOTE — Progress Notes (Signed)
Patient and RN reviewed discharge information with parents in room also.  RN answered questions to the best of her knowledge.  RN removed IV with no s/s of infection, catheter intact.  Patient discharged with paper prescription for narcotic.  Patient refused wheelchair and requested to walk to discharge.

## 2015-08-17 NOTE — Progress Notes (Addendum)
      [address]       [name] [address]             [phone]      4 Days Post-Op Procedure(s) (LRB): VIDEO ASSISTED THORACOSCOPY, plerual abrasion (Right) BLEBECTOMY (Right)   Subjective:  Mr. Bracewell has no new complaints.  He continues to have some discomfort along his lower rib cage.    Objective: Vital signs in last 24 hours: Temp:  [98.2 F (36.8 C)-98.5 F (36.9 C)] 98.4 F (36.9 C) (02/19 0429) Pulse Rate:  [63-72] 63 (02/19 0429) Cardiac Rhythm:  [-] Normal sinus rhythm (02/19 0731) Resp:  [16] 16 (02/19 0429) BP: (112-115)/(65-74) 115/65 mmHg (02/19 0429) SpO2:  [100 %] 100 % (02/19 0429)  Intake/Output from previous day: 02/18 0701 - 02/19 0700 In: 600 [P.O.:600] Out: 1600 [Urine:1600] Intake/Output this shift: Total I/O In: -  Out: 800 [Urine:800]  General appearance: alert, cooperative and no distress Heart: regular rate and rhythm Lungs: clear to auscultation bilaterally Abdomen: soft, non-tender; bowel sounds normal; no masses,  no organomegaly Wound: clean and dry  Lab Results:  Recent Labs  08/15/15 0400  WBC 10.8*  HGB 13.8  HCT 40.6  PLT 216   BMET: No results for input(s): NA, K, CL, CO2, GLUCOSE, BUN, CREATININE, CALCIUM in the last 72 hours.  PT/INR: No results for input(s): LABPROT, INR in the last 72 hours. ABG No results found for: PHART, HCO3, TCO2, ACIDBASEDEF, O2SAT CBG (last 3)  No results for input(s): GLUCAP in the last 72 hours.  Assessment/Plan: S/P Procedure(s) (LRB): VIDEO ASSISTED THORACOSCOPY, plerual abrasion (Right) BLEBECTOMY (Right)  1. Pneumothorax- repeat CXR was about 20%, ordered 2 V for this morning, which has not yet been completed 2. Pulm- sats okay without oxygen,continue IS 3. Dispo- will await CXR results, if pneumothorax remains stable or is improved, will plan to d/c patient home today   LOS: 7 days    Raford Pitcher, ERIN 08/17/2015  todays CXR reviewed Patient can be  discharged today on room air with follow-up in the office patient examined and medical record reviewed,agree with above note. Kathlee Nations Trigt III 08/17/2015

## 2015-08-22 ENCOUNTER — Other Ambulatory Visit: Payer: Self-pay | Admitting: Thoracic Surgery (Cardiothoracic Vascular Surgery)

## 2015-08-22 DIAGNOSIS — J939 Pneumothorax, unspecified: Secondary | ICD-10-CM

## 2015-08-26 ENCOUNTER — Ambulatory Visit (INDEPENDENT_AMBULATORY_CARE_PROVIDER_SITE_OTHER): Payer: Self-pay | Admitting: Thoracic Surgery (Cardiothoracic Vascular Surgery)

## 2015-08-26 ENCOUNTER — Ambulatory Visit
Admission: RE | Admit: 2015-08-26 | Discharge: 2015-08-26 | Disposition: A | Discharge status: Home / Self Care | Payer: No Typology Code available for payment source | Source: Ambulatory Visit | Attending: Thoracic Surgery (Cardiothoracic Vascular Surgery) | Admitting: Thoracic Surgery (Cardiothoracic Vascular Surgery)

## 2015-08-26 ENCOUNTER — Encounter: Payer: Self-pay | Admitting: Thoracic Surgery (Cardiothoracic Vascular Surgery)

## 2015-08-26 VITALS — BP 103/75 | HR 68 | Resp 16 | Ht 72.0 in | Wt 128.0 lb

## 2015-08-26 DIAGNOSIS — J939 Pneumothorax, unspecified: Secondary | ICD-10-CM

## 2015-08-26 DIAGNOSIS — Z8709 Personal history of other diseases of the respiratory system: Secondary | ICD-10-CM

## 2015-08-26 DIAGNOSIS — J439 Emphysema, unspecified: Secondary | ICD-10-CM

## 2015-08-26 DIAGNOSIS — Z09 Encounter for follow-up examination after completed treatment for conditions other than malignant neoplasm: Secondary | ICD-10-CM

## 2015-08-26 MED ORDER — OXYCODONE HCL 5 MG PO TABS: 5.0000 mg | ORAL_TABLET | ORAL | Status: DC | PRN

## 2015-08-26 NOTE — Progress Notes (Signed)
      [address]       [name] [address]             [phone]       HPI: Lily Kernen turns today for a scheduled postoperative follow-up visit.  He is a 40 year old man with a history of bilateral spontaneous pneumothoraces. He presented to the emergency room on 08/11/2015 with a recurrent right spontaneous pneumothorax. I did a right vats with blebectomy and pleural abrasion on 08/13/2015. He did well postoperatively and went home on day 4.  He has not had any problems with shortness of breath. He does still have incisional pain. He is taking oxycodone mostly at night before he goes to bed in the morning when he wakes up. He only occasionally has to use it during the day. 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 He quit smoking when he was admitted to the hospital.  Past Medical History  Diagnosis Date  . Spontaneous pneumothorax      Current Outpatient Prescriptions  Medication Sig Dispense Refill  . oxyCODONE (OXY IR/ROXICODONE) 5 MG immediate release tablet Take 1-2 tablets (5-10 mg total) by mouth every 4 (four) hours as needed for severe pain. 30 tablet 0   No current facility-administered medications for this visit.    Physical Exam BP 103/75 mmHg  Pulse 68  Resp 16  Ht 6' (1.829 m)  Wt 128 lb (58.06 kg)  BMI 17.36 kg/m2  SpO34 27% 40 year old man in no acute distress Alert and oriented 3 with no focal deficits Incisions healing well Lungs clear with equal breath sounds bilaterally  Diagnostic Tests: I personally reviewed his chest x-ray. It shows postoperative changes on the right. There is a small apical space.  Impression: 40 year old man with a history of previous bilateral spontaneous pneumothoraces. He presented with a recurrent right spontaneous pneumothorax couple of weeks ago. I did a right VATS, blebectomy, and pleural abrasion. He is doing well postoperatively. He does have some incisional  pain, which is to be expected. He is taking oxycodone for that. He has not run out of his prescription is running low. I gave him a new prescription for oxycodone 5 mg tablets one to 2 tablets 4 times daily as needed for pain dispense 30 tablets, no refills.  I advised him to wait until about 6 weeks postop to go back to work. We agreed on the date of April 3. If he feels like he is ready return sooner than that he contact the office and we will discuss it.  Plan: I will be happy to see Mr. Sweezy back any time if I can be of any further assistance with his care.  Loreli Slot, MD [facility] [phone]

## 2015-09-17 ENCOUNTER — Other Ambulatory Visit: Payer: Self-pay | Admitting: *Deleted

## 2015-09-17 DIAGNOSIS — G8918 Other acute postprocedural pain: Secondary | ICD-10-CM

## 2015-09-17 MED ORDER — OXYCODONE HCL 5 MG PO TABS: 5.0000 mg | ORAL_TABLET | ORAL | Status: DC | PRN

## 2016-01-27 ENCOUNTER — Other Ambulatory Visit: Payer: Self-pay | Admitting: Occupational Medicine

## 2016-01-27 ENCOUNTER — Ambulatory Visit: Payer: Self-pay

## 2016-01-27 DIAGNOSIS — Z0000 Encounter for general adult medical examination without abnormal findings: Secondary | ICD-10-CM

## 2016-09-05 ENCOUNTER — Emergency Department (HOSPITAL_COMMUNITY): Payer: BLUE CROSS/BLUE SHIELD

## 2016-09-05 ENCOUNTER — Encounter (HOSPITAL_COMMUNITY): Payer: Self-pay | Admitting: Emergency Medicine

## 2016-09-05 ENCOUNTER — Emergency Department (HOSPITAL_COMMUNITY)
Admission: EM | Admit: 2016-09-05 | Discharge: 2016-09-05 | Disposition: A | Discharge status: Home / Self Care | Payer: BLUE CROSS/BLUE SHIELD | Attending: Emergency Medicine | Admitting: Emergency Medicine

## 2016-09-05 DIAGNOSIS — Z87891 Personal history of nicotine dependence: Secondary | ICD-10-CM | POA: Insufficient documentation

## 2016-09-05 DIAGNOSIS — L03211 Cellulitis of face: Secondary | ICD-10-CM

## 2016-09-05 DIAGNOSIS — L0201 Cutaneous abscess of face: Secondary | ICD-10-CM | POA: Insufficient documentation

## 2016-09-05 DIAGNOSIS — Z79899 Other long term (current) drug therapy: Secondary | ICD-10-CM | POA: Insufficient documentation

## 2016-09-05 DIAGNOSIS — L729 Follicular cyst of the skin and subcutaneous tissue, unspecified: Secondary | ICD-10-CM | POA: Diagnosis present

## 2016-09-05 LAB — I-STAT CREATININE, ED: Creatinine, Ser: 0.9 mg/dL (ref 0.61–1.24)

## 2016-09-05 MED ORDER — HYDROCODONE-ACETAMINOPHEN 5-325 MG PO TABS
2.0000 | ORAL_TABLET | Freq: Once | ORAL | Status: AC
  Administered 2016-09-05: 2 via ORAL
  Filled 2016-09-05: qty 2

## 2016-09-05 MED ORDER — LIDOCAINE-EPINEPHRINE (PF) 2 %-1:200000 IJ SOLN
10.0000 mL | Freq: Once | INTRAMUSCULAR | Status: AC
  Administered 2016-09-05: 10 mL via INTRADERMAL
  Filled 2016-09-05: qty 20

## 2016-09-05 MED ORDER — MORPHINE SULFATE (PF) 4 MG/ML IV SOLN
4.0000 mg | Freq: Once | INTRAVENOUS | Status: AC
  Administered 2016-09-05: 4 mg via INTRAVENOUS
  Filled 2016-09-05: qty 1

## 2016-09-05 MED ORDER — IOPAMIDOL (ISOVUE-300) INJECTION 61%
INTRAVENOUS | Status: AC
  Filled 2016-09-05: qty 100

## 2016-09-05 MED ORDER — IOPAMIDOL (ISOVUE-300) INJECTION 61%
INTRAVENOUS | Status: AC
  Administered 2016-09-05: 75 mL via INTRAVENOUS
  Filled 2016-09-05: qty 75

## 2016-09-05 MED ORDER — DOXYCYCLINE HYCLATE 100 MG PO TABS: 100.0000 mg | ORAL_TABLET | Freq: Two times a day (BID) | ORAL | 0 refills | Status: DC

## 2016-09-05 MED ORDER — ONDANSETRON HCL 4 MG PO TABS
4.0000 mg | ORAL_TABLET | Freq: Once | ORAL | Status: AC
  Administered 2016-09-05: 4 mg via ORAL
  Filled 2016-09-05: qty 1

## 2016-09-05 MED ORDER — CLINDAMYCIN PHOSPHATE 600 MG/50ML IV SOLN
600.0000 mg | Freq: Once | INTRAVENOUS | Status: AC
  Administered 2016-09-05: 600 mg via INTRAVENOUS
  Filled 2016-09-05: qty 50

## 2016-09-05 MED ORDER — HYDROCODONE-ACETAMINOPHEN 5-325 MG PO TABS: ORAL_TABLET | ORAL | 0 refills | Status: DC

## 2016-09-05 NOTE — Discharge Instructions (Signed)
Please use doxycycline 2 times daily with food. Please see your plastic surgeon as suggested. Apply warm compresses to the cyst/abscess area. Use Norco for pain not improved by ibuprofen.

## 2016-09-05 NOTE — ED Notes (Signed)
MD at bedside. 

## 2016-09-05 NOTE — ED Triage Notes (Signed)
Pt presents with cyst on right side of face, pt reports having a consult this coming Friday with plastic surgeon but woke up with worsening swelling and pain. Also swelling is effecting pts sight out of right eye.

## 2016-09-05 NOTE — ED Provider Notes (Signed)
[facility]-EMERGENCY DEPT Provider Note   CSN: [id number] Arrival date & time: 09/05/16  1745     History   Chief Complaint Chief Complaint  Patient presents with  . Facial cyst    HPI Victor Jefferson is a 41 y.o. male.  Patient is a 41 year old male who presents to the emergency department with complaint of a cyst of his right face.  The patient states he's been dealing with this cyst for over a year. It has gotten larger in recent time. The patient is been seen by dermatology. They have referred the patient to plastic surgery because it has gotten larger. The patient presents to the emergency department because he is having right-sided headache, right-sided eye pain, and he says he feels as though something is swelling "behind his right eye". He has not had any change in vision, and particularly is not had any double vision. He's not had fever or chills related to this incident. He request assistance with his discomfort.   The history is provided by the patient.    Past Medical History:  Diagnosis Date  . Spontaneous pneumothorax     Patient Active Problem List   Diagnosis Date Noted  . S/P lung surgery, follow-up exam 08/13/2015  . Pneumothorax on right 08/10/2015    Past Surgical History:  Procedure Laterality Date  . STAPLING OF BLEBS Right 08/13/2015   Procedure: BLEBECTOMY;  Surgeon: Loreli SlotSteven C Hendrickson, MD;  [facility];  Service: Thoracic;  Laterality: Right;  [name]. VIDEO ASSISTED THORACOSCOPY Right 08/13/2015   Procedure: VIDEO ASSISTED THORACOSCOPY, plerual abrasion;  Surgeon: Loreli SlotSteven C Hendrickson, MD;  [facility];  Service: Thoracic;  Laterality: Right;       Home Medications    Prior to Admission medications   Medication Sig Start Date End Date Taking? Authorizing Provider  oxyCODONE (OXY IR/ROXICODONE) 5 MG immediate release tablet Take 1-2 tablets (5-10 mg total) by mouth every 4 (four) hours as needed for severe pain. 09/17/15   Alleen BorneBryan K Bartle, MD     Family History No family history on file.  Social History Social History  Substance Use Topics  . Smoking status: Former Smoker    Quit date: 08/10/2015  . Smokeless tobacco: Not on file  . Alcohol use No     Allergies   Patient has no known allergies.   Review of Systems Review of Systems  Constitutional: Negative for activity change.       All ROS Neg except as noted in HPI  HENT: Negative for nosebleeds.   Eyes: Positive for pain. Negative for photophobia, discharge, itching and visual disturbance.  Respiratory: Negative for cough, shortness of breath and wheezing.   Cardiovascular: Negative for chest pain and palpitations.  Gastrointestinal: Negative for abdominal pain and blood in stool.  Genitourinary: Negative for dysuria, frequency and hematuria.  Musculoskeletal: Negative for arthralgias, back pain and neck pain.  Skin: Negative.        Facial cyst  Neurological: Negative for dizziness, seizures and speech difficulty.  Psychiatric/Behavioral: Negative for confusion and hallucinations.     Physical Exam Updated Vital Signs BP 153/73 (BP Location: Left Arm)   Pulse 89   Temp 97.9 F (36.6 C) (Oral)   Resp 16   Ht 5\' 11"  (1.803 m)   Wt 61.2 kg   SpO2 96%   BMI 18.83 kg/m   Physical Exam  Constitutional: He is oriented to person, place, and time. He appears well-developed and well-nourished.  Non-toxic appearance.  HENT:  Head: Normocephalic.  Right Ear: Tympanic membrane and external ear normal.  Left Ear: Tympanic membrane and external ear normal.  There is a cyst under the right eye. It in habits approximately a third of the right cheek. The nasolabial fold is not compromised. There is some swelling of the right lower lid. There is no mass or sign of abscess of the right lower lid. Extraocular movements are intact.  Eyes: EOM and lids are normal. Pupils are equal, round, and reactive to light.  Neck: Normal range of motion. Neck supple. Carotid  bruit is not present.  Cardiovascular: Normal rate, regular rhythm, normal heart sounds, intact distal pulses and normal pulses.   Pulmonary/Chest: Breath sounds normal. No respiratory distress.  Abdominal: Soft. Bowel sounds are normal. There is no tenderness. There is no guarding.  Musculoskeletal: Normal range of motion.  Lymphadenopathy:       Head (right side): No submandibular adenopathy present.       Head (left side): No submandibular adenopathy present.    He has no cervical adenopathy.  Neurological: He is alert and oriented to person, place, and time. He has normal strength. No cranial nerve deficit or sensory deficit.  Skin: Skin is warm and dry.  Psychiatric: He has a normal mood and affect. His speech is normal.  Nursing note and vitals reviewed.    ED Treatments / Results  Labs (all labs ordered are listed, but only abnormal results are displayed) Labs Reviewed - No data to display  EKG  EKG Interpretation None       Radiology No results found.  Procedures .Marland KitchenIncision and Drainage Date/Time: 09/05/2016 11:28 PM Performed by: Ivery Quale Authorized by: Ivery Quale   Consent:    Consent obtained:  Verbal   Consent given by:  Patient   Risks discussed:  Bleeding, incomplete drainage and pain   Alternatives discussed:  Referral Location:    Type:  Abscess   Location: right face. Pre-procedure details:    Skin preparation:  Chloraprep Anesthesia (see MAR for exact dosages):    Anesthesia method:  Local infiltration   Local anesthetic:  Lidocaine 2% WITH epi Procedure type:    Complexity:  Simple Procedure details:    Incision types:  Stab incision   Scalpel blade:  11   Drainage:  Purulent   Drainage amount:  Copious   Wound treatment:  Wound left open Post-procedure details:    Patient tolerance of procedure:  Tolerated well, no immediate complications   (including critical care time)  Medications Ordered in ED Medications - No data to  display   Initial Impression / Assessment and Plan / ED Course Patient seen with me by Dr. Clarene Duke.   I have reviewed the triage vital signs and the nursing notes.  Pertinent labs & imaging results that were available during my care of the patient were reviewed by me and considered in my medical decision making (see chart for details).     **I have reviewed nursing notes, vital signs, and all appropriate lab and imaging results for this patient.  Final Clinical Impressions(s) / ED Diagnoses  MDM Vital signs within normal limits. Patient has a cyst/abscess of the right face that has been giving him an issue for nearly a year on. In recent weeks this problem has become worse. He is scheduled to see a plastic surgeon at the end of the week, but states the pain has become so severe that he cannot tolerate it any longer. He denies any fever.  The CT scan suggest an abscess area as well as cellulitis. The patient was treated with IV clindamycin. Incision and drainage was carried out to relieve some of the pressure and to allow the area to drain until the patient was seen by the plastic surgery specialist.  Prescription for Norco and doxycycline were given to the patient. Patient strongly encouraged to see the plastic surgeon as scheduled.    Final diagnoses:  Facial cellulitis    New Prescriptions New Prescriptions   No medications on file     Ivery Quale, PA-C 09/06/16 0103    Laurence Spates, MD 09/07/16 [phone]

## 2016-09-08 LAB — AEROBIC CULTURE W GRAM STAIN (SUPERFICIAL SPECIMEN)

## 2016-09-08 LAB — AEROBIC CULTURE  (SUPERFICIAL SPECIMEN)
CULTURE: NORMAL
SPECIAL REQUESTS: NORMAL

## 2016-09-10 ENCOUNTER — Encounter (HOSPITAL_COMMUNITY): Payer: Self-pay | Admitting: *Deleted

## 2016-09-11 ENCOUNTER — Ambulatory Visit: Payer: Self-pay | Admitting: Plastic Surgery

## 2016-09-11 DIAGNOSIS — L723 Sebaceous cyst: Secondary | ICD-10-CM

## 2016-09-13 ENCOUNTER — Ambulatory Visit (HOSPITAL_COMMUNITY): Payer: BLUE CROSS/BLUE SHIELD | Admitting: Certified Registered Nurse Anesthetist

## 2016-09-13 ENCOUNTER — Encounter (HOSPITAL_COMMUNITY): Payer: Self-pay | Admitting: Urology

## 2016-09-13 ENCOUNTER — Ambulatory Visit (HOSPITAL_COMMUNITY)
Admission: RE | Admit: 2016-09-13 | Discharge: 2016-09-13 | Disposition: A | Discharge status: Home / Self Care | Payer: BLUE CROSS/BLUE SHIELD | Source: Ambulatory Visit | Attending: Plastic Surgery | Admitting: Plastic Surgery

## 2016-09-13 ENCOUNTER — Encounter (HOSPITAL_COMMUNITY)
Admission: RE | Disposition: A | Discharge status: Home / Self Care | Payer: Self-pay | Source: Ambulatory Visit | Attending: Plastic Surgery

## 2016-09-13 DIAGNOSIS — B9689 Other specified bacterial agents as the cause of diseases classified elsewhere: Secondary | ICD-10-CM | POA: Insufficient documentation

## 2016-09-13 DIAGNOSIS — Z79891 Long term (current) use of opiate analgesic: Secondary | ICD-10-CM | POA: Insufficient documentation

## 2016-09-13 DIAGNOSIS — Z87891 Personal history of nicotine dependence: Secondary | ICD-10-CM | POA: Insufficient documentation

## 2016-09-13 DIAGNOSIS — Z9889 Other specified postprocedural states: Secondary | ICD-10-CM | POA: Insufficient documentation

## 2016-09-13 DIAGNOSIS — Z79899 Other long term (current) drug therapy: Secondary | ICD-10-CM | POA: Insufficient documentation

## 2016-09-13 DIAGNOSIS — L723 Sebaceous cyst: Secondary | ICD-10-CM | POA: Insufficient documentation

## 2016-09-13 HISTORY — DX: Dermatitis, unspecified: L30.9

## 2016-09-13 HISTORY — PX: MASS EXCISION: SHX2000

## 2016-09-13 LAB — CBC
HEMATOCRIT: 43.3 % (ref 39.0–52.0)
Hemoglobin: 14.4 g/dL (ref 13.0–17.0)
MCH: 31.4 pg (ref 26.0–34.0)
MCHC: 33.3 g/dL (ref 30.0–36.0)
MCV: 94.5 fL (ref 78.0–100.0)
PLATELETS: 286 10*3/uL (ref 150–400)
RBC: 4.58 MIL/uL (ref 4.22–5.81)
RDW: 13 % (ref 11.5–15.5)
WBC: 6.7 10*3/uL (ref 4.0–10.5)

## 2016-09-13 SURGERY — EXCISION MASS
Anesthesia: General | Laterality: Right

## 2016-09-13 MED ORDER — EPINEPHRINE PF 1 MG/10ML IJ SOSY
PREFILLED_SYRINGE | INTRAMUSCULAR | Status: AC
  Filled 2016-09-13: qty 10

## 2016-09-13 MED ORDER — 0.9 % SODIUM CHLORIDE (POUR BTL) OPTIME
TOPICAL | Status: DC | PRN
  Administered 2016-09-13: 1000 mL

## 2016-09-13 MED ORDER — BUPIVACAINE HCL (PF) 0.25 % IJ SOLN
INTRAMUSCULAR | Status: AC
  Filled 2016-09-13: qty 30

## 2016-09-13 MED ORDER — OXYCODONE HCL 5 MG/5ML PO SOLN: 5.0000 mg | Freq: Once | ORAL | Status: DC | PRN

## 2016-09-13 MED ORDER — HYDROCODONE-ACETAMINOPHEN 5-325 MG PO TABS: 1.0000 | ORAL_TABLET | Freq: Every evening | ORAL | 0 refills | Status: DC | PRN

## 2016-09-13 MED ORDER — LIDOCAINE 2% (20 MG/ML) 5 ML SYRINGE
INTRAMUSCULAR | Status: DC | PRN
  Administered 2016-09-13: 80 mg via INTRAVENOUS

## 2016-09-13 MED ORDER — PROPOFOL 10 MG/ML IV BOLUS
INTRAVENOUS | Status: DC | PRN
  Administered 2016-09-13: 160 mg via INTRAVENOUS

## 2016-09-13 MED ORDER — MIDAZOLAM HCL 2 MG/2ML IJ SOLN
INTRAMUSCULAR | Status: AC
  Filled 2016-09-13: qty 2

## 2016-09-13 MED ORDER — DEXAMETHASONE SODIUM PHOSPHATE 10 MG/ML IJ SOLN
INTRAMUSCULAR | Status: DC | PRN
Start: 2016-09-13 — End: 2016-09-13
  Administered 2016-09-13: 10 mg via INTRAVENOUS

## 2016-09-13 MED ORDER — SUCCINYLCHOLINE CHLORIDE 200 MG/10ML IV SOSY
PREFILLED_SYRINGE | INTRAVENOUS | Status: DC | PRN
  Administered 2016-09-13: 120 mg via INTRAVENOUS

## 2016-09-13 MED ORDER — FENTANYL CITRATE (PF) 100 MCG/2ML IJ SOLN
INTRAMUSCULAR | Status: AC
  Filled 2016-09-13: qty 4

## 2016-09-13 MED ORDER — BUPIVACAINE-EPINEPHRINE 0.25% -1:200000 IJ SOLN
INTRAMUSCULAR | Status: DC | PRN
  Administered 2016-09-13: 30 mL

## 2016-09-13 MED ORDER — MEPERIDINE HCL 25 MG/ML IJ SOLN: 6.2500 mg | INTRAMUSCULAR | Status: DC | PRN

## 2016-09-13 MED ORDER — OXYCODONE HCL 5 MG PO TABS: 5.0000 mg | ORAL_TABLET | Freq: Once | ORAL | Status: DC | PRN

## 2016-09-13 MED ORDER — LACTATED RINGERS IV SOLN
INTRAVENOUS | Status: DC | PRN
  Administered 2016-09-13: 07:00:00 via INTRAVENOUS

## 2016-09-13 MED ORDER — CEFAZOLIN SODIUM-DEXTROSE 2-4 GM/100ML-% IV SOLN
2.0000 g | INTRAVENOUS | Status: AC
  Administered 2016-09-13: 2 g via INTRAVENOUS

## 2016-09-13 MED ORDER — PROMETHAZINE HCL 25 MG/ML IJ SOLN: 6.2500 mg | INTRAMUSCULAR | Status: DC | PRN

## 2016-09-13 MED ORDER — HYDROMORPHONE HCL 1 MG/ML IJ SOLN: 0.2500 mg | INTRAMUSCULAR | Status: DC | PRN

## 2016-09-13 MED ORDER — PHENYLEPHRINE 40 MCG/ML (10ML) SYRINGE FOR IV PUSH (FOR BLOOD PRESSURE SUPPORT)
PREFILLED_SYRINGE | INTRAVENOUS | Status: DC | PRN
Start: 2016-09-13 — End: 2016-09-13
  Administered 2016-09-13: 80 ug via INTRAVENOUS

## 2016-09-13 MED ORDER — FENTANYL CITRATE (PF) 100 MCG/2ML IJ SOLN
INTRAMUSCULAR | Status: DC | PRN
  Administered 2016-09-13: 100 ug via INTRAVENOUS

## 2016-09-13 MED ORDER — LIDOCAINE-EPINEPHRINE (PF) 1 %-1:200000 IJ SOLN
INTRAMUSCULAR | Status: DC | PRN
  Administered 2016-09-13: 6 mL

## 2016-09-13 MED ORDER — LIDOCAINE-EPINEPHRINE (PF) 1 %-1:200000 IJ SOLN
INTRAMUSCULAR | Status: AC
  Filled 2016-09-13: qty 30

## 2016-09-13 MED ORDER — ONDANSETRON HCL 4 MG/2ML IJ SOLN
INTRAMUSCULAR | Status: DC | PRN
  Administered 2016-09-13: 4 mg via INTRAVENOUS

## 2016-09-13 MED ORDER — MIDAZOLAM HCL 2 MG/2ML IJ SOLN
INTRAMUSCULAR | Status: DC | PRN
  Administered 2016-09-13: 2 mg via INTRAVENOUS

## 2016-09-13 MED ORDER — PROPOFOL 10 MG/ML IV BOLUS
INTRAVENOUS | Status: AC
  Filled 2016-09-13: qty 40

## 2016-09-13 MED ORDER — CEFAZOLIN SODIUM-DEXTROSE 2-4 GM/100ML-% IV SOLN
INTRAVENOUS | Status: AC
  Filled 2016-09-13: qty 100

## 2016-09-13 SURGICAL SUPPLY — 46 items
AIRSTRIP 4 3/4X3 1/4 7185 (GAUZE/BANDAGES/DRESSINGS) IMPLANT
BLADE 10 SAFETY STRL DISP (BLADE) ×3 IMPLANT
BNDG CONFORM 2 STRL LF (GAUZE/BANDAGES/DRESSINGS) IMPLANT
BNDG GAUZE ELAST 4 BULKY (GAUZE/BANDAGES/DRESSINGS) IMPLANT
CANISTER SUCT 3000ML PPV (MISCELLANEOUS) IMPLANT
CATH ROBINSON RED A/P 16FR (CATHETERS) IMPLANT
CLEANER TIP ELECTROSURG 2X2 (MISCELLANEOUS) ×1 IMPLANT
CONT SPEC 4OZ CLIKSEAL STRL BL (MISCELLANEOUS) ×1 IMPLANT
COVER SURGICAL LIGHT HANDLE (MISCELLANEOUS) ×3 IMPLANT
DRAIN PENROSE 1/4X12 LTX STRL (WOUND CARE) IMPLANT
DRSG EMULSION OIL 3X3 NADH (GAUZE/BANDAGES/DRESSINGS) IMPLANT
DRSG MEPILEX BORDER 4X4 (GAUZE/BANDAGES/DRESSINGS) ×2 IMPLANT
ELECT COATED BLADE 2.86 ST (ELECTRODE) ×3 IMPLANT
ELECT NDL TIP 2.8 STRL (NEEDLE) IMPLANT
ELECT NEEDLE TIP 2.8 STRL (NEEDLE) IMPLANT
ELECT REM PT RETURN 9FT ADLT (ELECTROSURGICAL) ×3
ELECTRODE REM PT RTRN 9FT ADLT (ELECTROSURGICAL) ×1 IMPLANT
GAUZE PACKING IODOFORM 1/4X15 (GAUZE/BANDAGES/DRESSINGS) ×2 IMPLANT
GAUZE SPONGE 4X4 12PLY STRL (GAUZE/BANDAGES/DRESSINGS) IMPLANT
GAUZE SPONGE 4X4 16PLY XRAY LF (GAUZE/BANDAGES/DRESSINGS) IMPLANT
GLOVE BIO SURGEON STRL SZ 6.5 (GLOVE) ×2 IMPLANT
GLOVE BIO SURGEONS STRL SZ 6.5 (GLOVE) ×1
GOWN STRL REUS W/ TWL LRG LVL3 (GOWN DISPOSABLE) ×2 IMPLANT
GOWN STRL REUS W/TWL LRG LVL3 (GOWN DISPOSABLE) ×6
KIT BASIN OR (CUSTOM PROCEDURE TRAY) ×3 IMPLANT
KIT ROOM TURNOVER OR (KITS) ×3 IMPLANT
NDL 25GX 5/8IN NON SAFETY (NEEDLE) IMPLANT
NEEDLE 25GX 5/8IN NON SAFETY (NEEDLE) IMPLANT
NS IRRIG 1000ML POUR BTL (IV SOLUTION) ×3 IMPLANT
PAD ARMBOARD 7.5X6 YLW CONV (MISCELLANEOUS) ×6 IMPLANT
PENCIL BUTTON HOLSTER BLD 10FT (ELECTRODE) ×3 IMPLANT
SUT CHROMIC 4 0 P 3 18 (SUTURE) ×3 IMPLANT
SUT ETHILON 4 0 PS 2 18 (SUTURE) ×3 IMPLANT
SUT ETHILON 5 0 P 3 18 (SUTURE) ×2
SUT NYLON ETHILON 5-0 P-3 1X18 (SUTURE) ×1 IMPLANT
SUT SILK 4 0 (SUTURE) ×3
SUT SILK 4-0 18XBRD TIE 12 (SUTURE) ×1 IMPLANT
SWAB COLLECTION DEVICE MRSA (MISCELLANEOUS) ×2 IMPLANT
SYR BULB IRRIGATION 50ML (SYRINGE) IMPLANT
SYR TB 1ML LUER SLIP (SYRINGE) IMPLANT
TOWEL OR 17X24 6PK STRL BLUE (TOWEL DISPOSABLE) ×3 IMPLANT
TOWEL OR 17X26 10 PK STRL BLUE (TOWEL DISPOSABLE) ×3 IMPLANT
TRAY ENT MC OR (CUSTOM PROCEDURE TRAY) ×3 IMPLANT
TUBE ANAEROBIC SPECIMEN COL (MISCELLANEOUS) ×2 IMPLANT
WATER STERILE IRR 1000ML POUR (IV SOLUTION) ×1 IMPLANT
YANKAUER SUCT BULB TIP NO VENT (SUCTIONS) IMPLANT

## 2016-09-13 NOTE — Anesthesia Procedure Notes (Signed)
Procedure Name: Intubation Date/Time: 09/13/2016 7:24 AM Performed by: Geraldo DockerSOLHEIM, Shamieka Gullo SALOMON Pre-anesthesia Checklist: Patient identified, Patient being monitored, Timeout performed, Emergency Drugs available and Suction available Patient Re-evaluated:Patient Re-evaluated prior to inductionOxygen Delivery Method: Circle System Utilized Preoxygenation: Pre-oxygenation with 100% oxygen Intubation Type: IV induction Ventilation: Mask ventilation without difficulty Laryngoscope Size: Miller and 3 Grade View: Grade I Tube type: Oral Tube size: 8.0 mm Number of attempts: 1 Airway Equipment and Method: Stylet Placement Confirmation: ETT inserted through vocal cords under direct vision,  positive ETCO2 and breath sounds checked- equal and bilateral Secured at: 21 cm Tube secured with: Tape Dental Injury: Teeth and Oropharynx as per pre-operative assessment

## 2016-09-13 NOTE — Anesthesia Postprocedure Evaluation (Signed)
Anesthesia Post Note  Patient: Victor Jefferson  Procedure(s) Performed: Procedure(s) (LRB): EXCISION OF RIGHT CHEEK SEBACEOUS CYST (Right)  Patient location during evaluation: PACU Anesthesia Type: General Level of consciousness: awake and alert Pain management: pain level controlled Vital Signs Assessment: post-procedure vital signs reviewed and stable Respiratory status: spontaneous breathing, nonlabored ventilation and respiratory function stable Cardiovascular status: blood pressure returned to baseline and stable Postop Assessment: no signs of nausea or vomiting Anesthetic complications: no       Last Vitals:  Vitals:   09/13/16 0815 09/13/16 0830  BP: 108/65 106/68  Pulse: 79 70  Resp: 12 15  Temp:  36.1 C    Last Pain:  Vitals:   09/13/16 [id number]  TempSrc: Oral                 Lowella CurbWarren Ray Kairee Isa

## 2016-09-13 NOTE — Transfer of Care (Signed)
Immediate Anesthesia Transfer of Care Note  Patient: Victor Jefferson  Procedure(s) Performed: Procedure(s): EXCISION OF RIGHT CHEEK SEBACEOUS CYST (Right)  Patient Location: PACU  Anesthesia Type:General  Level of Consciousness: awake, alert , oriented and patient cooperative  Airway & Oxygen Therapy: Patient Spontanous Breathing and Patient connected to face mask oxygen  Post-op Assessment: Report given to RN, Post -op Vital signs reviewed and stable and Patient moving all extremities X 4  Post vital signs: Reviewed and stable  Last Vitals:  Vitals:   09/13/16 0645 09/13/16 0758  BP: 125/75   Pulse:    Resp:    Temp:  (P) 36.3 C    Last Pain:  Vitals:   09/13/16 0614  TempSrc: Oral      Patients Stated Pain Goal: 2 (09/13/16 16100642)  Complications: No apparent anesthesia complications

## 2016-09-13 NOTE — H&P (Signed)
Victor Jefferson is an 41 y.o. male.   Chief Complaint: cyst on face HPI: The patient is a 41 yrs old male here with his mom for treatment of a sebaceous cyst on his right cheek.  He has been dealing with the area for over a year.  Over the past few weeks it got more swollen and was seen for treatment.  He stated that the area was squeezed and he was given antibiotics.  The cheek became very swollen and spread to under the eye.  It was red and very tender.  Past Medical History:  Diagnosis Date  . Eczema   . Spontaneous pneumothorax     Past Surgical History:  Procedure Laterality Date  . STAPLING OF BLEBS Right 08/13/2015   Procedure: BLEBECTOMY;  Surgeon: Loreli SlotSteven C Hendrickson, MD;  [facility];  Service: Thoracic;  Laterality: Right;  [name]. VIDEO ASSISTED THORACOSCOPY Right 08/13/2015   Procedure: VIDEO ASSISTED THORACOSCOPY, plerual abrasion;  Surgeon: Loreli SlotSteven C Hendrickson, MD;  [facility];  Service: Thoracic;  Laterality: Right;    History reviewed. No pertinent family history. Social History:  reports that he quit smoking about 13 months ago. He quit after 18.00 years of use. He has never used smokeless tobacco. He reports that he drinks about 3.6 oz of alcohol per week . He reports that he does not use drugs.  Allergies: No Known Allergies  Medications Prior to Admission  Medication Sig Dispense Refill  . ibuprofen (ADVIL,MOTRIN) 200 MG tablet Take 400 mg by mouth every 6 (six) hours as needed.    . Multiple Vitamin (MULTIVITAMIN) tablet Take 1 tablet by mouth daily.    [name]. doxycycline (VIBRA-TABS) 100 MG tablet Take 1 tablet (100 mg total) by mouth 2 (two) times daily. 14 tablet 0  . HYDROcodone-acetaminophen (NORCO/VICODIN) 5-325 MG tablet 1 or 2 tabs PO q6 hours prn pain 15 tablet 0  . oxyCODONE (OXY IR/ROXICODONE) 5 MG immediate release tablet Take 1-2 tablets (5-10 mg total) by mouth every 4 (four) hours as needed for severe pain. (Patient not taking: Reported on 09/05/2016) 30  tablet 0    Results for orders placed or performed during the hospital encounter of 09/13/16 (from the past 48 hour(s))  CBC     Status: None   Collection Time: 09/13/16  6:08 AM  Result Value Ref Range   WBC 6.7 4.0 - 10.5 K/uL   RBC 4.58 4.22 - 5.81 MIL/uL   Hemoglobin 14.4 13.0 - 17.0 g/dL   HCT 45.443.3 09.839.0 - 11.952.0 %   MCV 94.5 78.0 - 100.0 fL   MCH 31.4 26.0 - 34.0 pg   MCHC 33.3 30.0 - 36.0 g/dL   RDW 14.713.0 82.911.5 - 56.215.5 %   Platelets 286 150 - 400 K/uL   No results found.  Review of Systems  Constitutional: Negative.   HENT: Negative.   Eyes: Negative.   Respiratory: Negative.   Cardiovascular: Negative.   Gastrointestinal: Negative.   Genitourinary: Negative.   Musculoskeletal: Negative.   Skin: Negative.   Neurological: Negative.   Psychiatric/Behavioral: Negative.     Blood pressure (!) 77/57, pulse (!) 56, temperature 98.1 F (36.7 C), temperature source Oral, resp. rate 20, SpO2 100 %. Physical Exam  Constitutional: He is oriented to person, place, and time. He appears well-developed and well-nourished.  HENT:  Head: Normocephalic.    Eyes: EOM are normal. Pupils are equal, round, and reactive to light.  Cardiovascular: Normal rate.   Respiratory: Effort normal. No respiratory  distress.  GI: He exhibits no distension.  Musculoskeletal: He exhibits no edema.  Neurological: He is alert and oriented to person, place, and time.  Skin: Skin is warm. There is erythema.  Psychiatric: He has a normal mood and affect. His behavior is normal. Judgment and thought content normal.     Assessment/Plan Excision of right cheek sebaceous cyst  Peggye Form, DO 09/13/2016, 6:45 AM

## 2016-09-13 NOTE — Discharge Instructions (Signed)
Iodoform packing to right cheek twice daily. May shower and wash face.

## 2016-09-13 NOTE — Op Note (Signed)
DATE OF OPERATION: 09/13/2016  [facility]  PREOPERATIVE DIAGNOSIS: right cheek sebaceous cyst  POSTOPERATIVE DIAGNOSIS: Same  PROCEDURE: excision of right cheek sebaceous cyst 2 cm.  SURGEON: Cherice Glennie Sanger Teriyah Purington, DO  EBL: 2 cc  CONDITION: Stable  COMPLICATIONS: None  INDICATION: The patient, Victor Jefferson, is a 41 y.o. 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   PROCEDURE DETAILS:  The patient was seen prior to surgery and marked.  The IV antibiotics were given. The patient was taken to the operating room and given a general anesthetic. A standard time out was performed and all information was confirmed by those in the room. SCDs were placed.   The face was prepped and draped.  Local was injected under the skin for intraoperative hemostasis.  The #15 blade was used to incise the skin.  There was thick discharge.  Specimens were sent for gram stain and cultures.  There was thick soft tissue.  The 2 cm capsule that could be identified was excised. The surrounding tissue was firm.  The area was irrigated.  Hemostasis was achieved with electrocautery.  The area was packed with iodoform gauze.  In order to no injure a nerve no further dissection was done. The patient was allowed to wake up and taken to recovery room in stable condition at the end of the case. The family was notified at the end of the case.

## 2016-09-13 NOTE — Anesthesia Preprocedure Evaluation (Signed)
Anesthesia Evaluation  Patient identified by MRN, date of birth, ID band Patient awake    Reviewed: Allergy & Precautions, NPO status , Patient's Chart, lab work & pertinent test results  History of Anesthesia Complications Negative for: history of anesthetic complications  Airway Mallampati: II  TM Distance: >3 FB Neck ROM: Full    Dental  (+) Teeth Intact   Pulmonary neg shortness of breath, neg sleep apnea, neg COPD, neg recent URI, Current Smoker, former smoker,    breath sounds clear to auscultation       Cardiovascular negative cardio ROS   Rhythm:Regular     Neuro/Psych negative neurological ROS  negative psych ROS   GI/Hepatic negative GI ROS, Neg liver ROS,   Endo/Other  negative endocrine ROS  Renal/GU negative Renal ROS     Musculoskeletal   Abdominal   Peds  Hematology negative hematology ROS (+)   Anesthesia Other Findings   Reproductive/Obstetrics                             Anesthesia Physical  Anesthesia Plan  ASA: I  Anesthesia Plan: General   Post-op Pain Management:    Induction: Intravenous  Airway Management Planned: Oral ETT  Additional Equipment:   Intra-op Plan:   Post-operative Plan: Extubation in OR  Informed Consent: I have reviewed the patients History and Physical, chart, labs and discussed the procedure including the risks, benefits and alternatives for the proposed anesthesia with the patient or authorized representative who has indicated his/her understanding and acceptance.   Dental advisory given  Plan Discussed with: CRNA and Surgeon  Anesthesia Plan Comments:         Anesthesia Quick Evaluation

## 2016-09-14 ENCOUNTER — Encounter (HOSPITAL_COMMUNITY): Payer: Self-pay | Admitting: Plastic Surgery

## 2016-09-21 ENCOUNTER — Ambulatory Visit (INDEPENDENT_AMBULATORY_CARE_PROVIDER_SITE_OTHER): Payer: BLUE CROSS/BLUE SHIELD | Admitting: Internal Medicine

## 2016-09-21 ENCOUNTER — Encounter: Payer: Self-pay | Admitting: Internal Medicine

## 2016-09-21 VITALS — BP 111/77 | Temp 97.5°F | Ht 71.0 in | Wt 129.0 lb

## 2016-09-21 DIAGNOSIS — A429 Actinomycosis, unspecified: Secondary | ICD-10-CM | POA: Diagnosis not present

## 2016-09-21 LAB — AEROBIC/ANAEROBIC CULTURE W GRAM STAIN (SURGICAL/DEEP WOUND)

## 2016-09-21 LAB — CBC WITH DIFFERENTIAL/PLATELET
BASOS PCT: 0 %
Basophils Absolute: 0 cells/uL (ref 0–200)
Eosinophils Absolute: 290 cells/uL (ref 15–500)
Eosinophils Relative: 5 %
HEMATOCRIT: 47.8 % (ref 38.5–50.0)
Hemoglobin: 15.9 g/dL (ref 13.2–17.1)
LYMPHS PCT: 42 %
Lymphs Abs: 2436 cells/uL (ref 850–3900)
MCH: 31.7 pg (ref 27.0–33.0)
MCHC: 33.3 g/dL (ref 32.0–36.0)
MCV: 95.2 fL (ref 80.0–100.0)
MONO ABS: 522 {cells}/uL (ref 200–950)
MPV: 8.2 fL (ref 7.5–12.5)
Monocytes Relative: 9 %
Neutro Abs: 2552 cells/uL (ref 1500–7800)
Neutrophils Relative %: 44 %
Platelets: 338 10*3/uL (ref 140–400)
RBC: 5.02 MIL/uL (ref 4.20–5.80)
RDW: 14 % (ref 11.0–15.0)
WBC: 5.8 10*3/uL (ref 3.8–10.8)

## 2016-09-21 LAB — AEROBIC/ANAEROBIC CULTURE (SURGICAL/DEEP WOUND)

## 2016-09-21 LAB — BASIC METABOLIC PANEL
BUN: 11 mg/dL (ref 7–25)
CHLORIDE: 101 mmol/L (ref 98–110)
CO2: 23 mmol/L (ref 20–31)
Calcium: 9.3 mg/dL (ref 8.6–10.3)
Creat: 0.92 mg/dL (ref 0.60–1.35)
Glucose, Bld: 91 mg/dL (ref 65–99)
POTASSIUM: 4.7 mmol/L (ref 3.5–5.3)
Sodium: 136 mmol/L (ref 135–146)

## 2016-09-21 MED ORDER — AMOXICILLIN 500 MG PO CAPS: 500.0000 mg | ORAL_CAPSULE | Freq: Three times a day (TID) | ORAL | 5 refills | Status: DC

## 2016-09-21 NOTE — Progress Notes (Signed)
RFV: actinomyces wound  Patient ID: Victor Jefferson, male   DOB: 02/11/1976, 41 y.o.   MRN: [id number]  HPI  Victor Jefferson is a 41yo M hx of recurrent PTX (x 3), hx of pleural abrasian and blebectomy in 2017 who recently had new onset swelling of his right cheek found to have right cheek cyst that was excised by dr Ulice Bolddillingham. Or cx growing actinomyces  The cyst had been there for over a year but most recently becoming very swollen and tender. He was seen in the ed who attempted to decompress presumed furuncle,and given a course of doxycycline. 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 No smoking but occasional drinking. He is korean-african [place]. Outpatient Encounter Prescriptions as of 09/21/2016  Medication Sig  . doxycycline (VIBRA-TABS) 100 MG tablet Take 1 tablet (100 mg total) by mouth 2 (two) times daily.  [name]. HYDROcodone-acetaminophen (NORCO) 5-325 MG tablet Take 1 tablet by mouth at bedtime as needed for moderate pain.  [name]. HYDROcodone-acetaminophen (NORCO/VICODIN) 5-325 MG tablet 1 or 2 tabs PO q6 hours prn pain  . ibuprofen (ADVIL,MOTRIN) 200 MG tablet Take 400 mg by mouth every 6 (six) hours as needed.  . Multiple Vitamin (MULTIVITAMIN) tablet Take 1 tablet by mouth daily.  [name]. oxyCODONE (OXY IR/ROXICODONE) 5 MG immediate release tablet Take 1-2 tablets (5-10 mg total) by mouth every 4 (four) hours as needed for severe pain.   No facility-administered encounter medications on file as of 09/21/2016.      Patient Active Problem List   Diagnosis Date Noted  . S/P lung surgery, follow-up exam 08/13/2015  . Pneumothorax on right 08/10/2015     Health Maintenance Due  Topic Date Due  . HIV Screening   03/30/1993  . INFLUENZA VACCINE  01/27/2016    Social History  Substance Use Topics  . Smoking status: Former Smoker    Years: 18.00    Quit date: 08/10/2015  . Smokeless tobacco: Never Used  . Alcohol use 3.6 oz/week    6 Cans of beer per week   Family hx = reviewed, GM has DM. Review of Systems Review of Systems  Constitutional: Negative for fever, chills, diaphoresis, activity change, appetite change, fatigue and unexpected weight change.  HENT: Negative for congestion, sore throat, rhinorrhea, sneezing, trouble swallowing and sinus pressure.  Eyes: Negative for photophobia and visual disturbance.  Respiratory: Negative for cough, chest tightness, shortness of breath, wheezing and stridor.  Cardiovascular: Negative for chest pain, palpitations and leg swelling.  Gastrointestinal: Negative for nausea, vomiting, abdominal pain, diarrhea, constipation, blood in stool, abdominal distention and anal bleeding.  Genitourinary: Negative for dysuria, hematuria, flank pain and difficulty urinating.  Musculoskeletal: Negative for myalgias, back pain, joint swelling, arthralgias and gait problem.  Skin: swelling and tenderness to right cheek Neurological: Negative for dizziness, tremors, weakness and light-headedness.  Hematological: Negative for adenopathy. Does not bruise/bleed easily.  Psychiatric/Behavioral: Negative for behavioral problems, confusion, sleep disturbance, dysphoric mood, decreased concentration and agitation.    Physical Exam   BP 111/77 (BP Location: Right Arm, Patient Position: Sitting, Cuff Size: Normal)   Temp 97.5 F (36.4 C) (Oral)   Ht 5\' 11"  (1.803 m)  Wt 129 lb (58.5 kg)   SpO2 97%   BMI 17.99 kg/m   Physical Exam  Constitutional: He is oriented to person, place, and time. He appears well-developed and well-nourished. No distress.  HENT: +right cheek induration Mouth/Throat: Oropharynx is clear and moist. No oropharyngeal exudate.  Cardiovascular: Normal  rate, regular rhythm and normal heart sounds. Exam reveals no gallop and no friction rub.  No murmur heard.  Pulmonary/Chest: Effort normal and breath sounds normal. No respiratory distress. He has no wheezes.  Abdominal: Soft. Bowel sounds are normal. He exhibits no distension. There is no tenderness.  Lymphadenopathy:  He has no cervical adenopathy.  Neurological: He is alert and oriented to person, place, and time.  Skin: Skin is warm and dry.healing small 1 cm incision. Serous drainage. No erythema still has some remaining induration to right cheek and superior margin goes up to inferior eyelid near bridge of nose. Psychiatric: He has a normal mood and affect. His behavior is normal.    CBC Lab Results  Component Value Date   WBC 6.7 09/13/2016   RBC 4.58 09/13/2016   HGB 14.4 09/13/2016   HCT 43.3 09/13/2016   PLT 286 09/13/2016   MCV 94.5 09/13/2016   MCH 31.4 09/13/2016   MCHC 33.3 09/13/2016   RDW 13.0 09/13/2016   LYMPHSABS 1.7 07/09/2009   MONOABS 0.6 07/09/2009   EOSABS 0.1 07/09/2009    BMET Lab Results  Component Value Date   NA 140 08/14/2015   K 4.1 08/14/2015   CL 105 08/14/2015   CO2 27 08/14/2015   GLUCOSE 137 (H) 08/14/2015   BUN 7 08/14/2015   CREATININE 0.90 09/05/2016   CALCIUM 8.8 (L) 08/14/2015   GFRNONAA >60 08/14/2015   GFRAA >60 08/14/2015      Assessment and Plan  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 Will check labs to see if any leukocytosis and any change in cr function to warrant dose adjustment of antibiotics. Will check sed rate and crp to see if can follow as markers  Health maintenance =will check hiv ab  Spent 45 min with patient in counseling in regards to actinomyces infection

## 2016-09-22 LAB — C-REACTIVE PROTEIN: CRP: 0.4 mg/L (ref <8.0)

## 2016-09-22 LAB — HIV ANTIBODY (ROUTINE TESTING W REFLEX): HIV: NONREACTIVE

## 2016-09-22 LAB — SEDIMENTATION RATE: SED RATE: 1 mm/h (ref 0–15)

## 2016-10-07 ENCOUNTER — Ambulatory Visit: Payer: Self-pay | Admitting: Internal Medicine

## 2017-02-15 ENCOUNTER — Emergency Department (HOSPITAL_COMMUNITY): Payer: BLUE CROSS/BLUE SHIELD

## 2017-02-15 ENCOUNTER — Emergency Department (HOSPITAL_COMMUNITY)
Admission: EM | Admit: 2017-02-15 | Discharge: 2017-02-15 | Disposition: A | Discharge status: Home / Self Care | Payer: BLUE CROSS/BLUE SHIELD | Attending: Emergency Medicine | Admitting: Emergency Medicine

## 2017-02-15 ENCOUNTER — Encounter (HOSPITAL_COMMUNITY): Payer: Self-pay | Admitting: Emergency Medicine

## 2017-02-15 DIAGNOSIS — J9383 Other pneumothorax: Secondary | ICD-10-CM | POA: Diagnosis not present

## 2017-02-15 DIAGNOSIS — R0789 Other chest pain: Secondary | ICD-10-CM | POA: Diagnosis present

## 2017-02-15 DIAGNOSIS — F17200 Nicotine dependence, unspecified, uncomplicated: Secondary | ICD-10-CM | POA: Insufficient documentation

## 2017-02-15 LAB — BASIC METABOLIC PANEL
ANION GAP: 7 (ref 5–15)
BUN: 12 mg/dL (ref 6–20)
CO2: 30 mmol/L (ref 22–32)
Calcium: 9 mg/dL (ref 8.9–10.3)
Chloride: 102 mmol/L (ref 101–111)
Creatinine, Ser: 0.87 mg/dL (ref 0.61–1.24)
GFR calc non Af Amer: >60 mL/min (ref >60)
GLUCOSE: 101 mg/dL — AB (ref 65–99)
POTASSIUM: 4.2 mmol/L (ref 3.5–5.1)
Sodium: 139 mmol/L (ref 135–145)

## 2017-02-15 LAB — CBC
HEMATOCRIT: 41.6 % (ref 39.0–52.0)
HEMOGLOBIN: 14.2 g/dL (ref 13.0–17.0)
MCH: 32.5 pg (ref 26.0–34.0)
MCHC: 34.1 g/dL (ref 30.0–36.0)
MCV: 95.2 fL (ref 78.0–100.0)
Platelets: 257 10*3/uL (ref 150–400)
RBC: 4.37 MIL/uL (ref 4.22–5.81)
RDW: 14.1 % (ref 11.5–15.5)
WBC: 7.9 10*3/uL (ref 4.0–10.5)

## 2017-02-15 LAB — POCT I-STAT TROPONIN I: Troponin i, poc: 0 ng/mL (ref 0.00–0.08)

## 2017-02-15 IMAGING — CT CT CHEST W/O CM
2 of 3 series · 13 of 36 positions shown, 16 images · non-contrast
Comparison: Radiograph dated 08/10/2015

CLINICAL DATA: 39-year-old male with spontaneous pneumothorax and
right-sided chest tube.

EXAM:
CT CHEST WITHOUT CONTRAST
TECHNIQUE: Multidetector CT imaging of the chest was performed following the
standard protocol without IV contrast.

[Series 201: chest without, idose (2) · axial · non-contrast · 0.62mm/px · z∈[+47,+327]mm · 10 of 66 slices shown, 13 images]
[im 5/66  mediastinal]
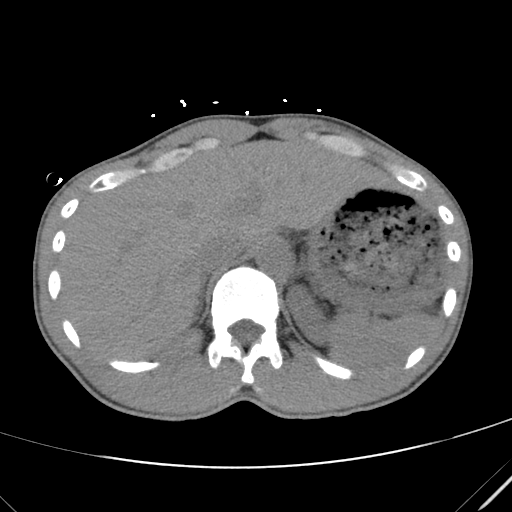
[im 5/66  lung]
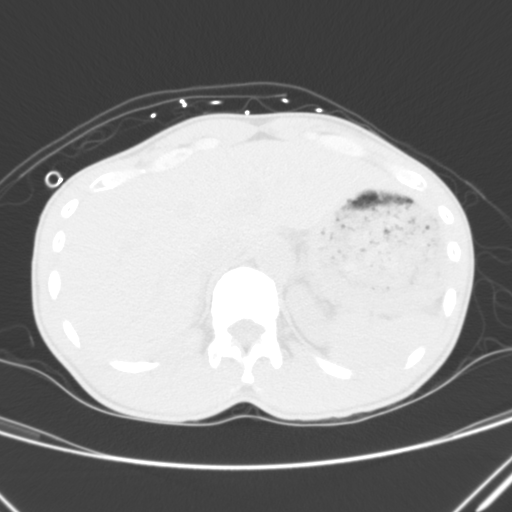
[im 10/66  lung]
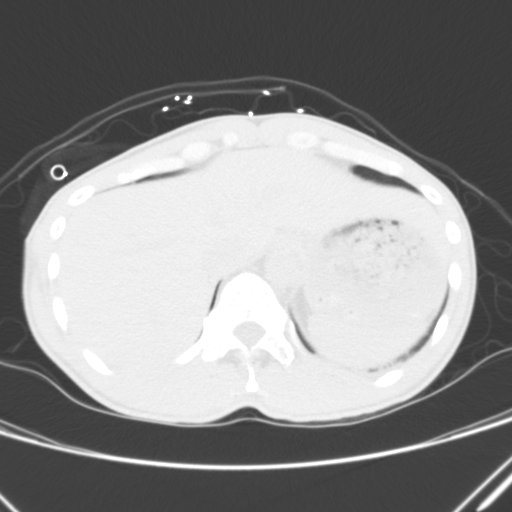
[im 17/66  lung]
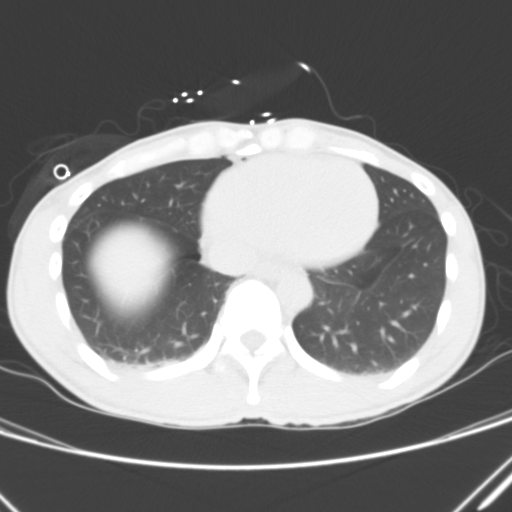
[im 25/66  lung]
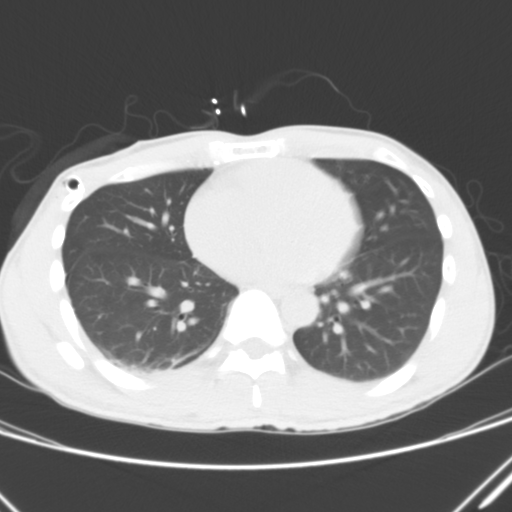
[im 29/66  mediastinal]
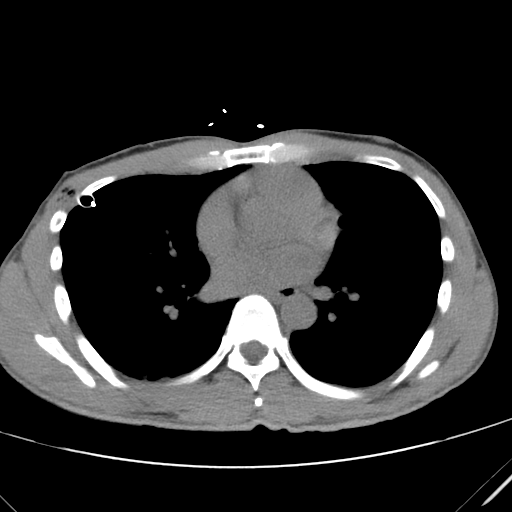
[im 29/66  lung]
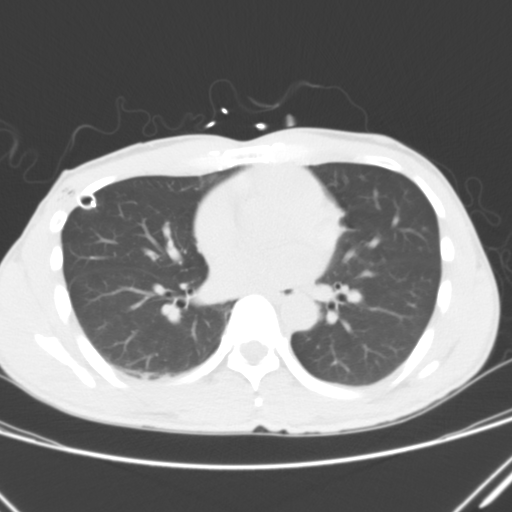
[im 37/66  lung]
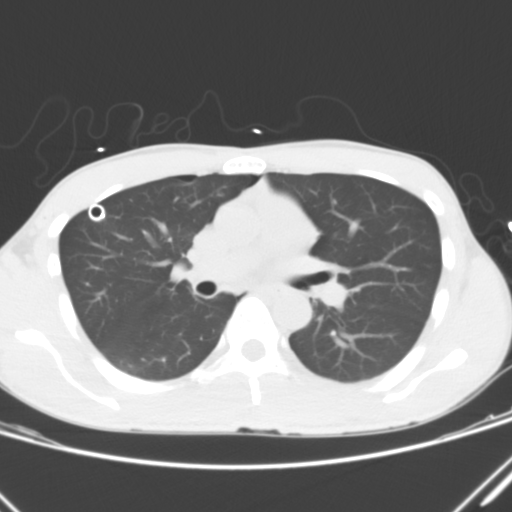
[im 41/66  lung]
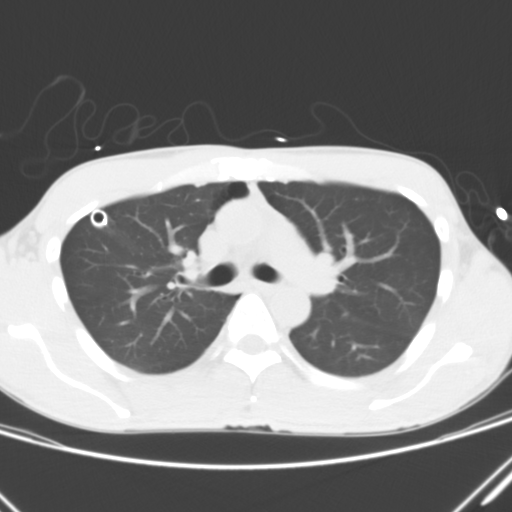
[im 49/66  lung]
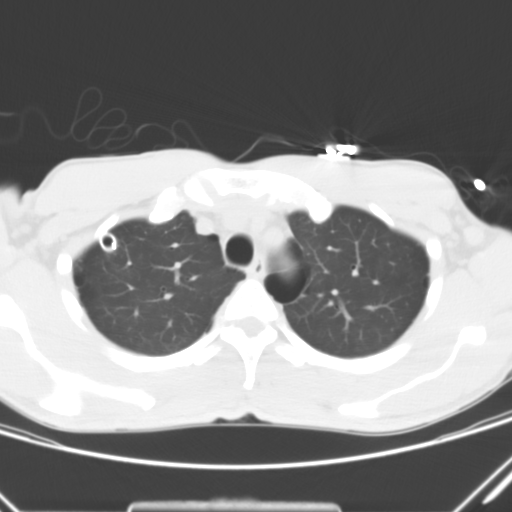
[im 56/66  mediastinal]
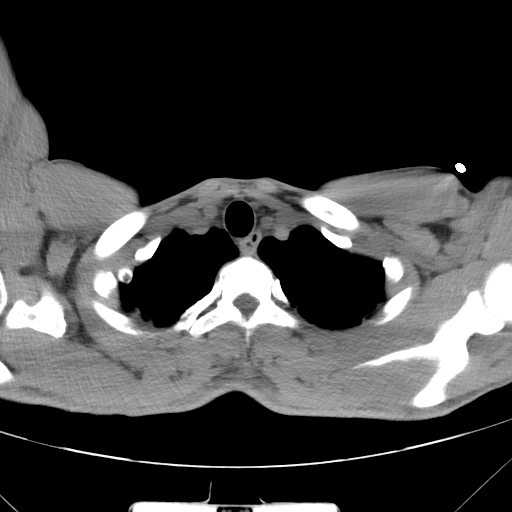
[im 56/66  lung]
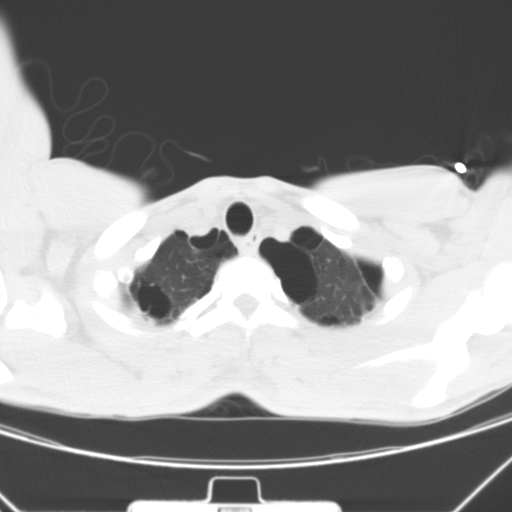
[im 61/66  lung]
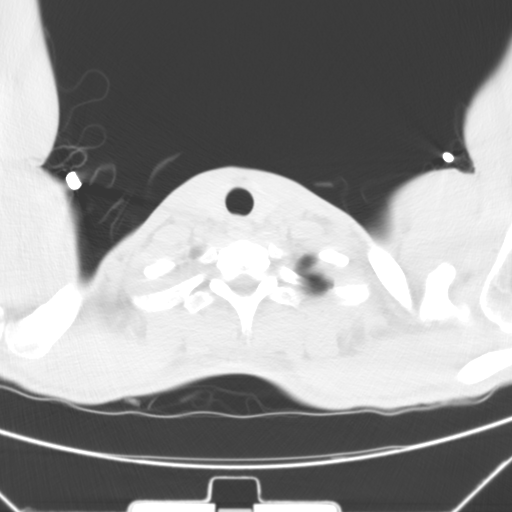

[Series 206: coronal, idose (2) · coronal · 0.45mm/px · 3 of 73 slices shown]
[im 15/73  lung]
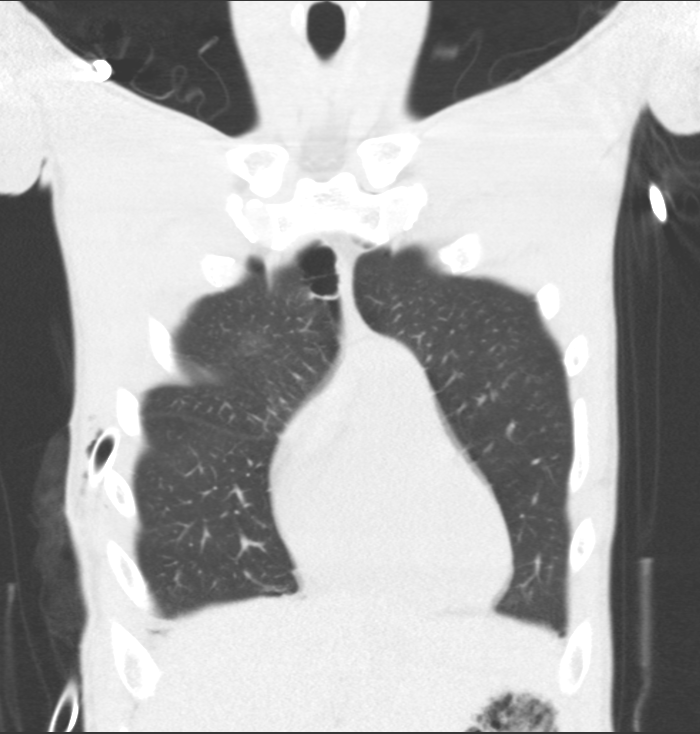
[im 29/73  lung]
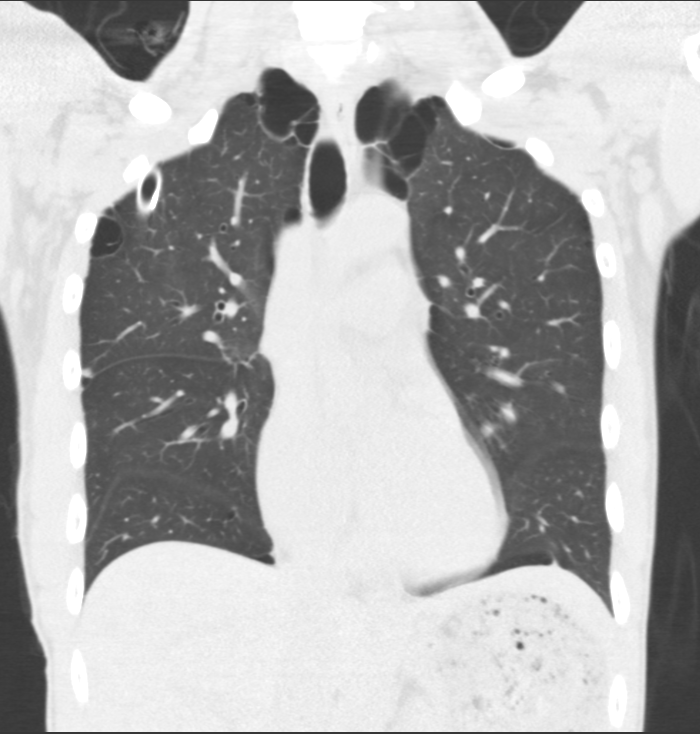
[im 44/73  lung]
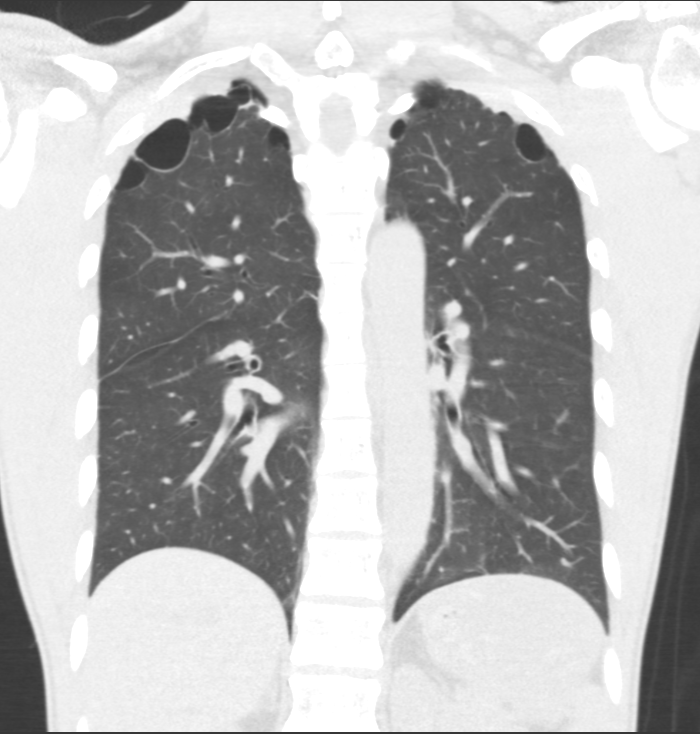

[13 of 36 positions shown; findings below may reference images not displayed]

FINDINGS: Evaluation of this exam is limited in the absence of intravenous
contrast.

The right-sided chest tube is noted entering from the right anterior
chest wall between the fourth and fifth ribs with tip in the right
anterior apical region. Minimal pneumothorax noted along the right
anterior pleural surface as well as along the right hemidiaphragm.
There are biapical subpleural blebs measuring up to 4.3 cm on the
left. The lungs are otherwise clear. There is no pleural effusion.

The central airways are patent. The thoracic aorta and central
pulmonary arteries are grossly unremarkable on this noncontrast
study. There is no cardiomegaly or pericardial effusion. No hilar or
mediastinal adenopathy. The esophagus and the thyroid gland appear
unremarkable. There is no axillary adenopathy. Small pockets of air
noted adjacent to the chest tube in the right anterior chest wall.
The chest wall soft tissues are otherwise unremarkable. The osseous
structures are intact. The visualized upper abdomen is grossly
unremarkable.
IMPRESSION: Right-sided chest tube with tip in the right apical region. Minimal
pneumothorax noted along the right anterior pleural surface as well
as along the right hemidiaphragm.

Biapical subpleural blebs.

## 2017-02-15 NOTE — ED Provider Notes (Signed)
[facility]-EMERGENCY DEPT Provider Note   CSN: [id number] Arrival date & time: 02/15/17  0813     History   Chief Complaint Chief Complaint  Patient presents with  . Chest Pain    HPI Demontrez Rindfleisch is a 41 y.o. male.  Patient is a 41 year old male with a history of recurrent spontaneous pneumothoraces 2 presents with chest pain. He states he had a sudden onset of right side upper chest pain that started last night. States it's worse when he breathes. He had some shortness of breath last night when he was sleeping but denies any currently. He denies any fevers. He does not smoke but he does use vapors. He's had 3 prior pneumothoraces which have required chest tubes. His most recent pneumothorax was in February 2017 where he had video assisted thoracostomy along with resection of blebs and mechanical pleurodesis. He states his chest pain today feels similar to his prior episodes of pneumothoraces.      Past Medical History:  Diagnosis Date  . Eczema   . Spontaneous pneumothorax     Patient Active Problem List   Diagnosis Date Noted  . S/P lung surgery, follow-up exam 08/13/2015  . Pneumothorax on right 08/10/2015    Past Surgical History:  Procedure Laterality Date  . MASS EXCISION Right 09/13/2016   Procedure: EXCISION OF RIGHT CHEEK SEBACEOUS CYST;  Surgeon: Alena Bills Dillingham, DO;  Location: [facility] OR;  Service: Plastics;  Laterality: Right;  . STAPLING OF BLEBS Right 08/13/2015   Procedure: BLEBECTOMY;  Surgeon: Loreli Slot, MD;  Location: [facility] OR;  Service: Thoracic;  Laterality: Right;  [name] VIDEO ASSISTED THORACOSCOPY Right 08/13/2015   Procedure: VIDEO ASSISTED THORACOSCOPY, plerual abrasion;  Surgeon: Loreli Slot, MD;  Location: [facility] OR;  Service: Thoracic;  Laterality: Right;       Home Medications    Prior to Admission medications   Medication Sig Start Date End Date Taking? Authorizing Provider  HYDROcodone-acetaminophen (NORCO/VICODIN) 5-325 MG tablet 1 or  2 tabs PO q6 hours prn pain Patient taking differently: Take 1-2 tablets by mouth every 6 (six) hours as needed for moderate pain.  09/05/16  Yes Ivery Quale, PA-C  ibuprofen (ADVIL,MOTRIN) 200 MG tablet Take 400 mg by mouth every 4 (four) hours as needed for moderate pain.   Yes [provider]  amoxicillin (AMOXIL) 500 MG capsule Take 1 capsule (500 mg total) by mouth 3 (three) times daily. Patient not taking: Reported on 02/15/2017 09/21/16   Judyann Munson, MD  doxycycline (VIBRA-TABS) 100 MG tablet Take 1 tablet (100 mg total) by mouth 2 (two) times daily. Patient not taking: Reported on 02/15/2017 09/05/16   Ivery Quale, PA-C  HYDROcodone-acetaminophen [facility]) 5-325 MG tablet Take 1 tablet by mouth at bedtime as needed for moderate pain. Patient not taking: Reported on 02/15/2017 09/13/16   Dillingham, Alena Bills, DO  oxyCODONE (OXY IR/ROXICODONE) 5 MG immediate release tablet Take 1-2 tablets (5-10 mg total) by mouth every 4 (four) hours as needed for severe pain. Patient not taking: Reported on 02/15/2017 09/17/15   Alleen Borne, MD    Family History No family history on file.  Social History Social History  Substance Use Topics  . Smoking status: Former Smoker    Years: 18.00    Quit date: 08/10/2015  . Smokeless tobacco: Never Used  . Alcohol use 3.6 oz/week    6 Cans of beer per week     Allergies   Patient has no known allergies.   Review  of Systems Review of Systems  Constitutional: Negative for chills, diaphoresis, fatigue and fever.  HENT: Negative for congestion, rhinorrhea and sneezing.   Eyes: Negative.   Respiratory: Positive for shortness of breath. Negative for cough and chest tightness.   Cardiovascular: Positive for chest pain. Negative for leg swelling.  Gastrointestinal: Negative for abdominal pain, blood in stool, diarrhea, nausea and vomiting.  Genitourinary: Negative for difficulty urinating, flank pain, frequency and hematuria.    Musculoskeletal: Negative for arthralgias and back pain.  Skin: Negative for rash.  Neurological: Negative for dizziness, speech difficulty, weakness, numbness and headaches.     Physical Exam Updated Vital Signs BP 111/81   Pulse (!) 58   Temp 98.1 F (36.7 C) (Oral)   Resp 14   Ht 5\' 10"  (1.778 m)   Wt 61.2 kg (135 lb)   SpO2 100%   BMI 19.37 kg/m   Physical Exam  Constitutional: He is oriented to person, place, and time. He appears well-developed and well-nourished.  HENT:  Head: Normocephalic and atraumatic.  Eyes: Pupils are equal, round, and reactive to light.  Neck: Normal range of motion. Neck supple.  Cardiovascular: Normal rate, regular rhythm and normal heart sounds.   Pulmonary/Chest: Effort normal and breath sounds normal. No respiratory distress. He has no wheezes. He has no rales. He exhibits no tenderness.  Abdominal: Soft. Bowel sounds are normal. There is no tenderness. There is no rebound and no guarding.  Musculoskeletal: Normal range of motion. He exhibits no edema.  Lymphadenopathy:    He has no cervical adenopathy.  Neurological: He is alert and oriented to person, place, and time.  Skin: Skin is warm and dry. No rash noted.  Psychiatric: He has a normal mood and affect.     ED Treatments / Results  Labs (all labs ordered are listed, but only abnormal results are displayed) Labs Reviewed  BASIC METABOLIC PANEL - Abnormal; Notable for the following:       Result Value   Glucose, Bld 101 (*)    All other components within normal limits  CBC  I-STAT TROPONIN, ED  POCT I-STAT TROPONIN I    EKG  EKG Interpretation  Date/Time:  Tuesday February 15 2017 08:24:40 EDT Ventricular Rate:  64 PR Interval:    QRS Duration: 85 QT Interval:  414 QTC Calculation: 428 R Axis:   -98 Text Interpretation:  Sinus rhythm Left anterior fascicular block since last tracing no significant change Confirmed by Rolan Bucco [phone]) on 02/15/2017 11:39:33 AM        Radiology Dg Chest 2 View  Result Date: 02/15/2017 CLINICAL DATA:  History of multiple pneumothoraces. EXAM: CHEST  2 VIEW COMPARISON:  02/15/2017. FINDINGS: Postsurgical changes right lung. Mediastinum hilar structures normal. Hyperexpansion of both lung fields noted consistent with COPD. Bilateral pleural-parenchymal thickening consistent with scarring. No pleural effusion or pneumothorax. IMPRESSION: COPD. pleuroparenchymal scarring. No pneumothorax noted on today's exam. Electronically Signed   By: Maisie Fus  Register   On: 02/15/2017 15:23   Dg Chest 2 View  Result Date: 02/15/2017 CLINICAL DATA:  New onset of right-sided chest and arm pain yesterday associated with shortness of breath. History of pneumothorax in the past. EXAM: CHEST  2 VIEW COMPARISON:  Chest x-ray of January 27, 2016 FINDINGS: The lungs remain hyperinflated. There is a less than 5% right apical pneumothorax. There is no pleural effusion. There is no mediastinal shift. There is stable biapical pleural thickening. There is surgical suture material in the right suprahilar region. The  heart and pulmonary vascularity are normal. The bony thorax is unremarkable. IMPRESSION: Less than 5% right apical pneumothorax is suspected. Stable hyperinflation and biapical scarring. Critical Value/emergent results were called by telephone at the time of interpretation on 02/15/2017 at 10:13 am to Dr. Lorre Nick , who verbally acknowledged these results. Electronically Signed   By: David  [place] M.D.   On: 02/15/2017 10:14    Procedures Procedures (including critical care time)  Medications Ordered in ED Medications - No data to display   Initial Impression / Assessment and Plan / ED Course  I have reviewed the triage vital signs and the nursing notes.  Pertinent labs & imaging results that were available during my care of the patient were reviewed by me and considered in my medical decision making (see chart for details).     Patient  presents with a tiny apical pneumothorax. I spoke with CT surgery. They request a repeat chest x-ray which was done about 5 hours after the initial one. This showed no evidence of pneumothorax. They will arrange an appointment for the patient to follow-up tomorrow with Dr. Dorris Fetch to have a follow-up chest x-ray. Patient is amenable to this plan. Return precautions were given.  Final Clinical Impressions(s) / ED Diagnoses   Final diagnoses:  Spontaneous pneumothorax    New Prescriptions New Prescriptions   No medications on file     Rolan Bucco, MD 02/15/17 [phone]

## 2017-02-15 NOTE — ED Triage Notes (Signed)
Patient reports right sided chest pain that radiates to neck and right arm when lying down that started yesterday. Patient reports that has PMH of collapsed lung and feels similar.

## 2017-02-16 IMAGING — CR DG CHEST 1V PORT
1 series · 1 of 1 positions shown · non-contrast
Comparison: CT chest and chest radiographs 08/10/2015.

CLINICAL DATA: Right chest tube.

EXAM:
PORTABLE CHEST 1 VIEW

[AP]
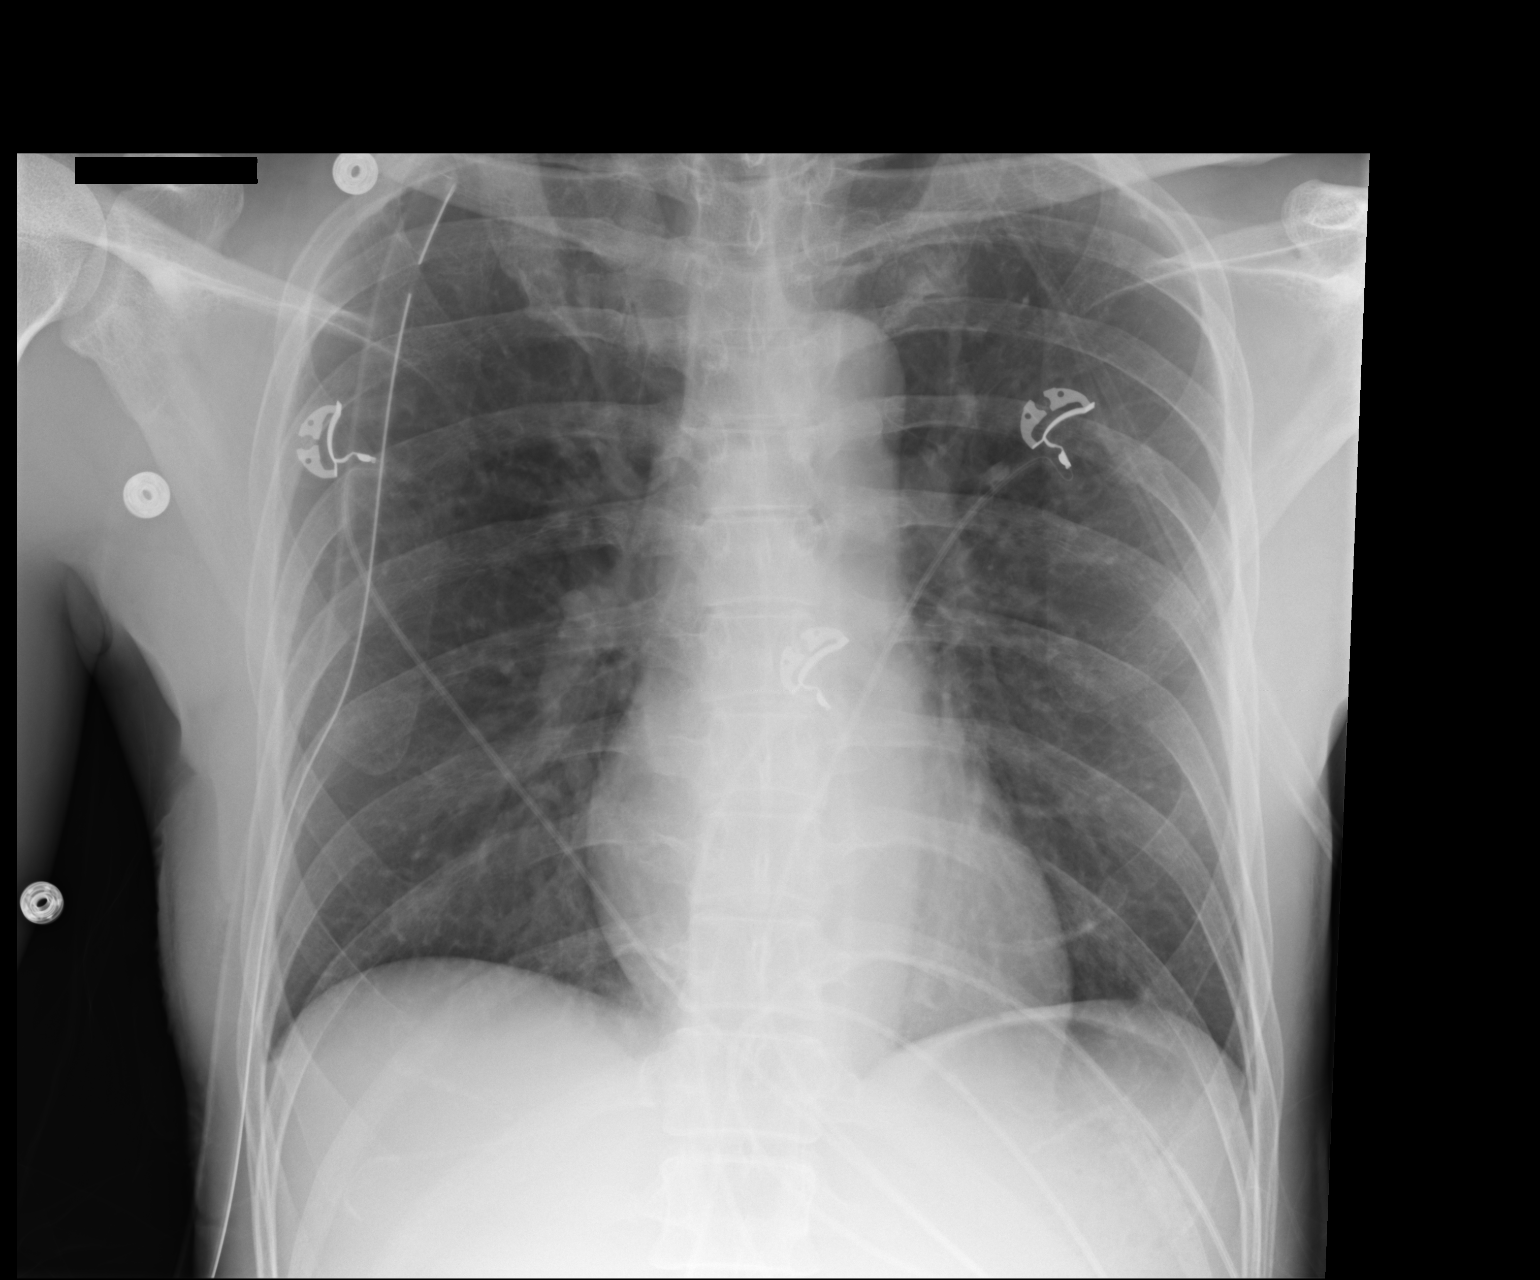

[1 of 1 positions shown; findings below may reference images not displayed]

FINDINGS: Trachea is midline. Heart size normal. Right chest tube terminates
at the apex of the right hemi thorax. Bullous changes at the apices.
No definite pneumothorax. Lungs are otherwise clear. No pleural
fluid.
IMPRESSION: 1. Right chest tube in place without pneumothorax.
2. Bullous changes at the apices.

## 2017-02-17 IMAGING — CR DG CHEST 1V PORT
1 series · 1 of 1 positions shown · non-contrast
Comparison: 08/11/2015

CLINICAL DATA: History of pneumothorax

EXAM:
PORTABLE CHEST 1 VIEW

[AP]
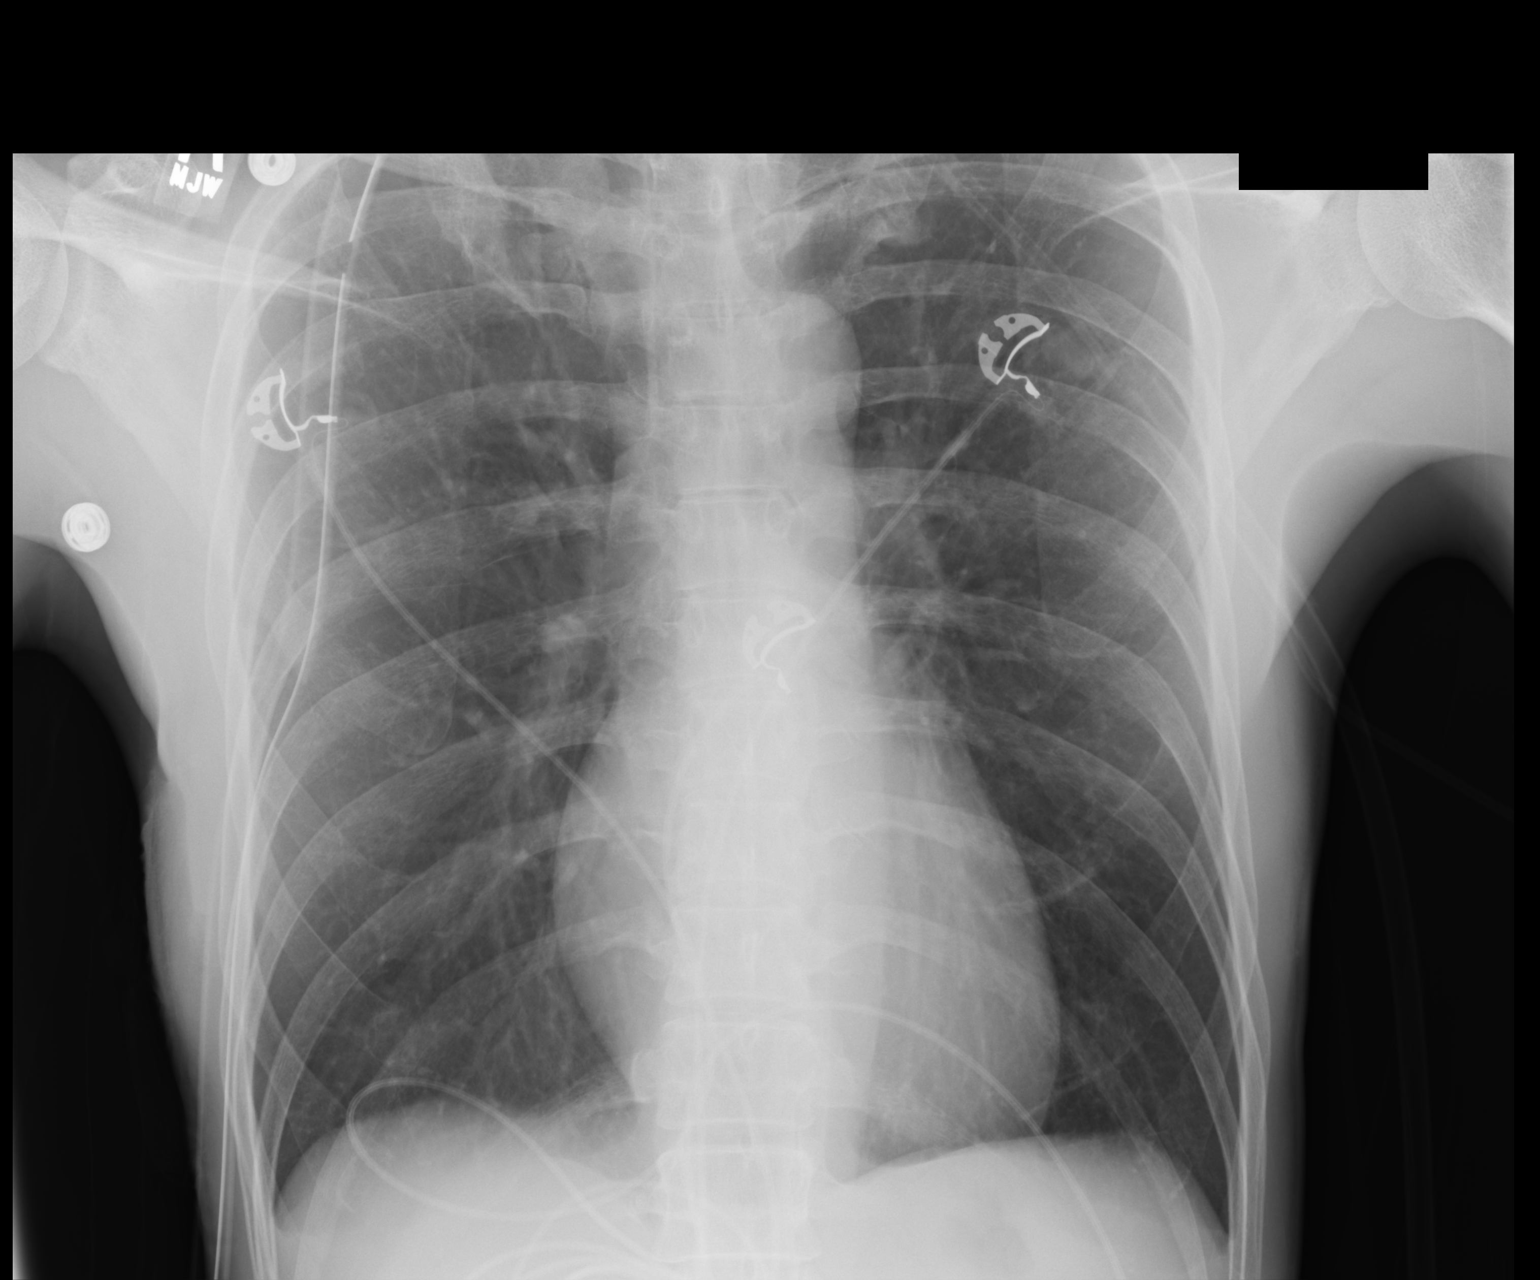

[1 of 1 positions shown; findings below may reference images not displayed]

FINDINGS: Cardiac shadow is within normal limits. The lungs are well aerated
bilaterally. No focal infiltrate or sizable effusion is seen. No
pneumothorax is noted. Right chest tube is again identified. Minimal
scarring is noted in the apices bilaterally.
IMPRESSION: No change from the prior exam.  No recurrent pneumothorax seen.

## 2017-02-19 IMAGING — DX DG CHEST 1V PORT
1 series · 1 of 1 positions shown · non-contrast
Comparison: August 13, 2015 chest radiograph and chest CT
August 10, 2015

CLINICAL DATA: Status post video assisted thoracostomy with chest
tube placement on the right.

EXAM:
PORTABLE CHEST 1 VIEW

[chest ap]
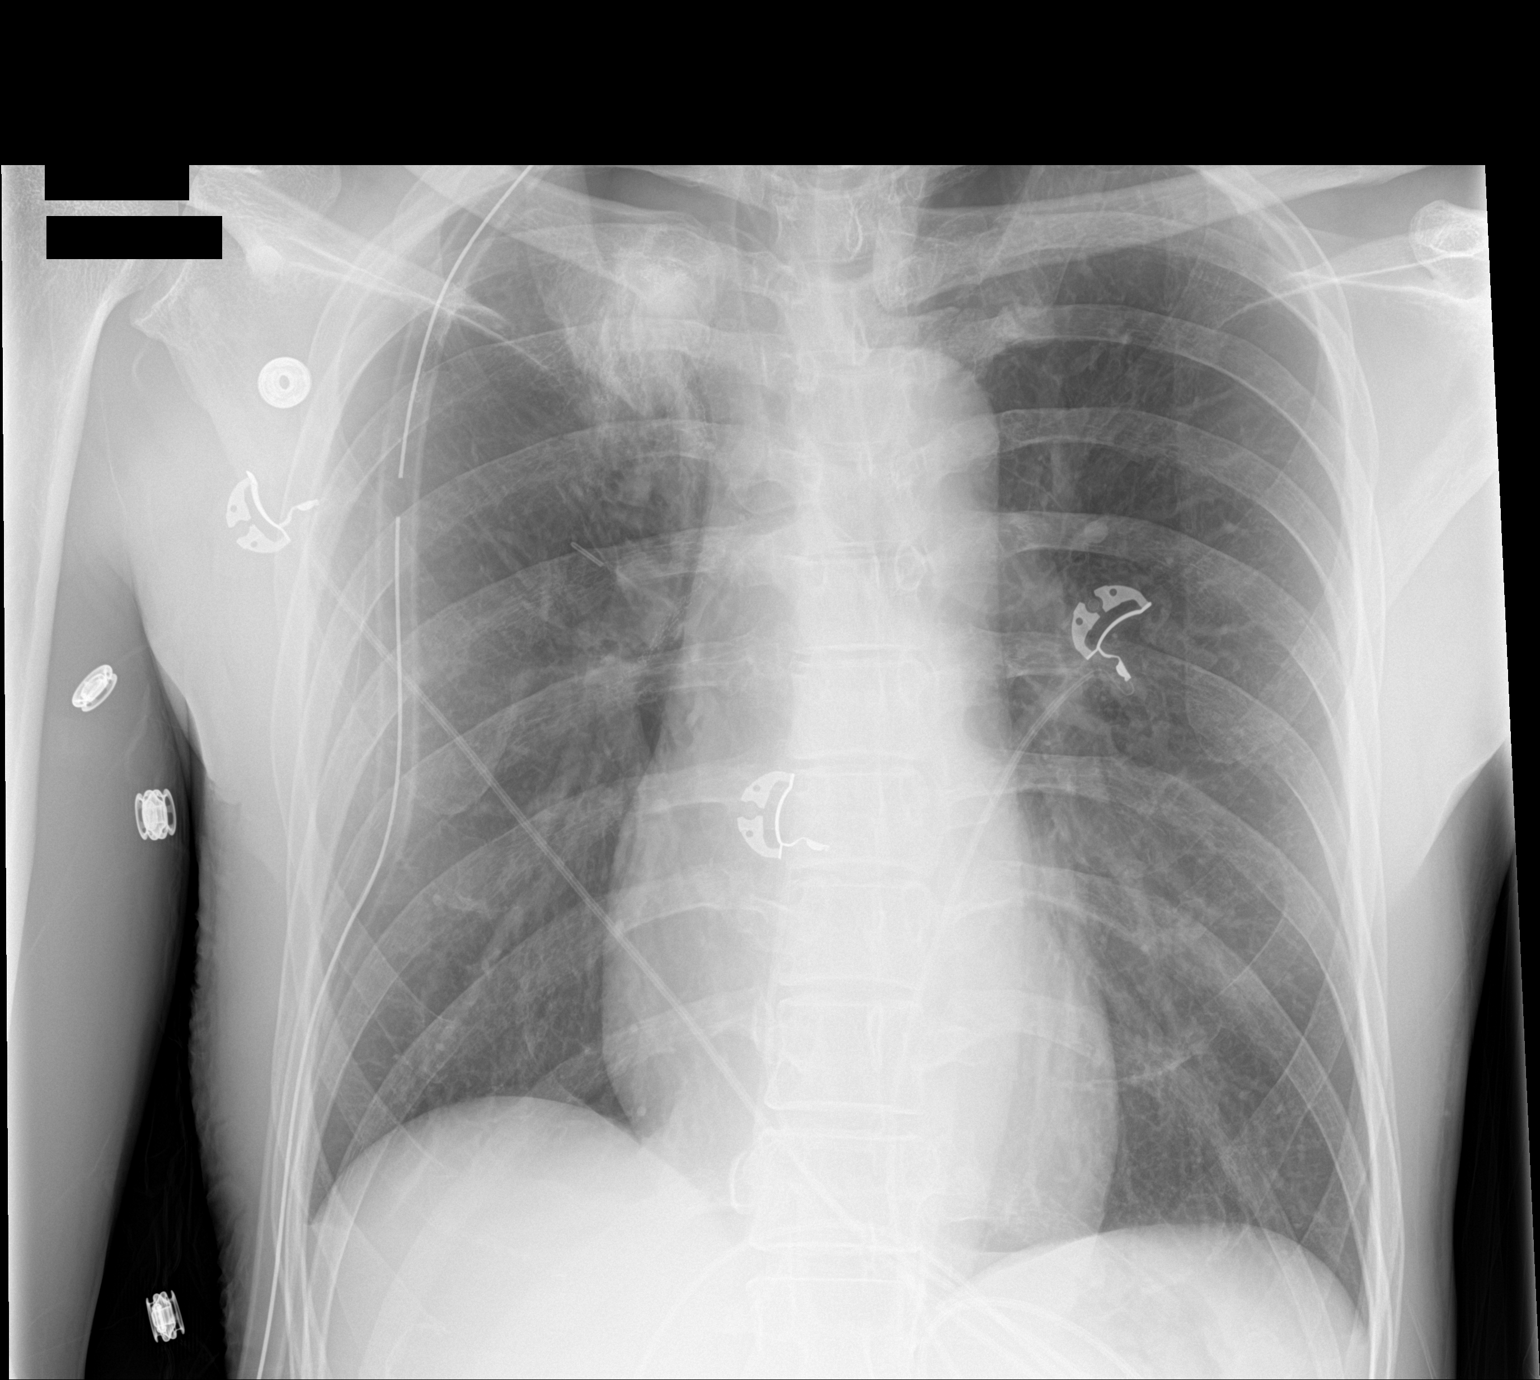

[1 of 1 positions shown; findings below may reference images not displayed]

FINDINGS: Chest tube is present on the right. There is a stable small medial
pneumothorax on the right. There is postoperative change on the
right. There is focal increased opacity in the medial right upper
lobe near the apex, likely postoperative hematoma. There are bullae
in the apices. There is no new opacity. Left lung is clear. Heart
size and pulmonary vascularity are normal. No demonstrable
pneumothorax.
IMPRESSION: Stable small medial pneumothorax on the right with right chest tube
in place, stable. Postoperative change on the right, stable. Stable
opacity right upper lobe medially near the apex which may represent
hematoma. Left lung clear. Bullae noted in the apices. No change in
cardiac silhouette.

## 2017-02-22 ENCOUNTER — Other Ambulatory Visit: Payer: Self-pay | Admitting: Thoracic Surgery (Cardiothoracic Vascular Surgery)

## 2017-02-22 DIAGNOSIS — J939 Pneumothorax, unspecified: Secondary | ICD-10-CM

## 2017-02-23 ENCOUNTER — Ambulatory Visit
Admission: RE | Admit: 2017-02-23 | Discharge: 2017-02-23 | Disposition: A | Discharge status: Home / Self Care | Payer: BLUE CROSS/BLUE SHIELD | Source: Ambulatory Visit | Attending: Thoracic Surgery (Cardiothoracic Vascular Surgery) | Admitting: Thoracic Surgery (Cardiothoracic Vascular Surgery)

## 2017-02-23 ENCOUNTER — Encounter: Payer: Self-pay | Admitting: Thoracic Surgery (Cardiothoracic Vascular Surgery)

## 2017-02-23 ENCOUNTER — Ambulatory Visit (INDEPENDENT_AMBULATORY_CARE_PROVIDER_SITE_OTHER): Payer: BLUE CROSS/BLUE SHIELD | Admitting: Thoracic Surgery (Cardiothoracic Vascular Surgery)

## 2017-02-23 VITALS — BP 111/73 | HR 80 | Resp 20 | Ht 70.0 in | Wt 132.0 lb

## 2017-02-23 DIAGNOSIS — J939 Pneumothorax, unspecified: Secondary | ICD-10-CM

## 2017-02-23 DIAGNOSIS — Z8709 Personal history of other diseases of the respiratory system: Secondary | ICD-10-CM

## 2017-02-23 NOTE — Progress Notes (Signed)
[address]       [place] [id number]             [phone]    HPI: Victor Jefferson turns today for follow-up regarding a possible pneumothorax  Victor Jefferson is a 41 year old man who had right VATS, bleb resection, and pleural abrasion in February 2017 for recurrent right pneumothorax. 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 He said it hurt all of them down the front end up into his right shoulder. He thought he had another pneumothorax. He had a chest x-ray which showed a question of a tiny apical pneumothorax. His pain has improved significantly and is no longer taking hydrocodone.  Past Medical History:  Diagnosis Date  . Eczema   . Spontaneous pneumothorax    Past Surgical History:  Procedure Laterality Date  . MASS EXCISION Right 09/13/2016   Procedure: EXCISION OF RIGHT CHEEK SEBACEOUS CYST;  Surgeon: Alena Billslaire S Dillingham, DO;  Location: [facility] OR;  Service: Plastics;  Laterality: Right;  . STAPLING OF BLEBS Right 08/13/2015   Procedure: BLEBECTOMY;  Surgeon: Loreli SlotSteven C Adrick Kestler, MD;  [facility];  Service: Thoracic;  Laterality: Right;  [name]. VIDEO ASSISTED THORACOSCOPY Right 08/13/2015   Procedure: VIDEO ASSISTED THORACOSCOPY, plerual abrasion;  Surgeon: Loreli SlotSteven C Blaiden Werth, MD;  [facility];  Service: Thoracic;  Laterality: Right;    Current Outpatient Prescriptions  Medication Sig Dispense Refill  . HYDROcodone-acetaminophen (NORCO/VICODIN) 5-325 MG tablet 1 or 2 tabs PO q6 hours prn pain (Patient taking differently: Take 1-2 tablets by mouth every 6 (six) hours as needed for moderate pain. ) 15 tablet 0  . ibuprofen (ADVIL,MOTRIN) 200 MG tablet Take 400 mg by mouth every 4 (four) hours as needed for moderate pain.     No current facility-administered medications for this visit.     Physical Exam BP 111/73   Pulse 80   Resp 20   Ht 5\' 10"  (1.778 m)   Wt 132 lb (59.9 kg)   SpO2  97% Comment: RA  BMI 18.94 kg/m  Thin 41 year old man in no acute distress No chest wall tenderness to palpation, no deformity Lungs clear with equal breath sounds bilaterally  Diagnostic Tests: CHEST  2 VIEW  COMPARISON:  PA and lateral chest x-ray of February 15, 2017  FINDINGS: The lungs are well-expanded. There is no pneumothorax, pneumomediastinum, or pleural effusion. There is stable biapical pleural thickening. The heart and pulmonary vascularity are normal. The mediastinum is normal in width. The bony thorax exhibits no acute abnormality.  IMPRESSION: There is no recurrent pneumothorax nor other acute cardiopulmonary abnormality.   Electronically Signed   By: David  SwazilandJordan M.D.   On: 02/23/2017 15:02 I personally reviewed the chest x-ray and concur with the findings noted above  Impression: Victor Jefferson's a 41 year old gentleman who has a history of a right VATS for recurrent spontaneous pneumothoraces. He had a sudden onset of chest pain while at work last week. It was similar in nature to his previous pneumothorax pain and he was concerned that he had a recurrence. A chest x-ray was read as having a small pneumothorax. I personally reviewed that chest x-ray and see what the radiologist pointed out, although I am not convinced that is was truly a pneumothorax. Even if it was it was of no clinical significance and his lung stayed inflated.  I recommended that he not lift  anything over 25 pounds for the next 2 weeks until 03/07/2017. He may return to work on 03/02/2017 if he feels up to it.  Plan: I will be happy to see him back back if I can be of any further assistance  Loreli Slot, MD [facility] [phone]

## 2017-07-28 ENCOUNTER — Ambulatory Visit (HOSPITAL_COMMUNITY)
Admission: RE | Admit: 2017-07-28 | Discharge: 2017-07-28 | Disposition: A | Discharge status: Home / Self Care | Payer: BLUE CROSS/BLUE SHIELD | Attending: Psychiatry | Admitting: Psychiatry

## 2017-07-28 DIAGNOSIS — F1210 Cannabis abuse, uncomplicated: Secondary | ICD-10-CM | POA: Diagnosis not present

## 2017-07-28 DIAGNOSIS — Z1330 Encounter for screening examination for mental health and behavioral disorders, unspecified: Secondary | ICD-10-CM | POA: Diagnosis present

## 2017-07-28 DIAGNOSIS — F4320 Adjustment disorder, unspecified: Secondary | ICD-10-CM | POA: Diagnosis not present

## 2017-07-28 NOTE — [facility] Counselor (Signed)
Pt was not seen by TTS.  Pt was accompanied by his father. Per Pt he was sent by his job's counselor for SA treatment due to a failed drug test. The Pt's therapist Mrs. Longerbone [facility]. The Pt states that [facility].  Pt was provided with information about [facility] program. Ava Elisabeth MostStevenson with [facility] was contacted in reference to the Pt.   Wolfgang PhoenixBrandi Josmar Messimer, [facility] Triage Specialist

## 2017-08-02 ENCOUNTER — Ambulatory Visit (INDEPENDENT_AMBULATORY_CARE_PROVIDER_SITE_OTHER): Payer: BLUE CROSS/BLUE SHIELD | Admitting: Psychology

## 2017-08-02 ENCOUNTER — Encounter (HOSPITAL_COMMUNITY): Payer: Self-pay | Admitting: Psychology

## 2017-08-02 DIAGNOSIS — F1210 Cannabis abuse, uncomplicated: Secondary | ICD-10-CM | POA: Diagnosis not present

## 2017-08-02 NOTE — Addendum Note (Signed)
Addended byLogan Bores: Milbert Bixler on: 08/02/2017 10:41 AM   Modules accepted: Level of Service
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
## 2017-12-05 ENCOUNTER — Emergency Department (HOSPITAL_COMMUNITY)
Admission: EM | Admit: 2017-12-05 | Discharge: 2017-12-05 | Disposition: A | Discharge status: Home / Self Care | Payer: BLUE CROSS/BLUE SHIELD | Attending: Emergency Medicine | Admitting: Emergency Medicine

## 2017-12-05 ENCOUNTER — Emergency Department (HOSPITAL_COMMUNITY): Payer: BLUE CROSS/BLUE SHIELD

## 2017-12-05 ENCOUNTER — Other Ambulatory Visit: Payer: Self-pay

## 2017-12-05 ENCOUNTER — Encounter (HOSPITAL_COMMUNITY): Payer: Self-pay | Admitting: Emergency Medicine

## 2017-12-05 DIAGNOSIS — F10929 Alcohol use, unspecified with intoxication, unspecified: Secondary | ICD-10-CM | POA: Diagnosis present

## 2017-12-05 DIAGNOSIS — T148XXA Other injury of unspecified body region, initial encounter: Secondary | ICD-10-CM

## 2017-12-05 DIAGNOSIS — Z23 Encounter for immunization: Secondary | ICD-10-CM | POA: Insufficient documentation

## 2017-12-05 DIAGNOSIS — Y999 Unspecified external cause status: Secondary | ICD-10-CM | POA: Insufficient documentation

## 2017-12-05 DIAGNOSIS — Z720 Tobacco use: Secondary | ICD-10-CM | POA: Diagnosis not present

## 2017-12-05 DIAGNOSIS — S0003XA Contusion of scalp, initial encounter: Secondary | ICD-10-CM | POA: Diagnosis not present

## 2017-12-05 DIAGNOSIS — Y9389 Activity, other specified: Secondary | ICD-10-CM | POA: Diagnosis not present

## 2017-12-05 DIAGNOSIS — Y92481 Parking lot as the place of occurrence of the external cause: Secondary | ICD-10-CM | POA: Diagnosis not present

## 2017-12-05 DIAGNOSIS — R780 Finding of alcohol in blood: Secondary | ICD-10-CM | POA: Diagnosis not present

## 2017-12-05 DIAGNOSIS — S50311A Abrasion of right elbow, initial encounter: Secondary | ICD-10-CM | POA: Diagnosis not present

## 2017-12-05 DIAGNOSIS — S0990XA Unspecified injury of head, initial encounter: Secondary | ICD-10-CM | POA: Diagnosis not present

## 2017-12-05 DIAGNOSIS — Y908 Blood alcohol level of 240 mg/100 ml or more: Secondary | ICD-10-CM | POA: Insufficient documentation

## 2017-12-05 LAB — CBC WITH DIFFERENTIAL/PLATELET
Basophils Absolute: 0 10*3/uL (ref 0.0–0.1)
Basophils Relative: 0 %
EOS PCT: 1 %
Eosinophils Absolute: 0.1 10*3/uL (ref 0.0–0.7)
HCT: 45 % (ref 39.0–52.0)
HEMOGLOBIN: 15.4 g/dL (ref 13.0–17.0)
LYMPHS ABS: 1.6 10*3/uL (ref 0.7–4.0)
LYMPHS PCT: 16 %
MCH: 32.9 pg (ref 26.0–34.0)
MCHC: 34.2 g/dL (ref 30.0–36.0)
MCV: 96.2 fL (ref 78.0–100.0)
Monocytes Absolute: 0.5 10*3/uL (ref 0.1–1.0)
Monocytes Relative: 5 %
Neutro Abs: 8.1 10*3/uL — ABNORMAL HIGH (ref 1.7–7.7)
Neutrophils Relative %: 78 %
PLATELETS: 360 10*3/uL (ref 150–400)
RBC: 4.68 MIL/uL (ref 4.22–5.81)
RDW: 14.5 % (ref 11.5–15.5)
WBC: 10.4 10*3/uL (ref 4.0–10.5)

## 2017-12-05 LAB — COMPREHENSIVE METABOLIC PANEL
ALK PHOS: 77 U/L (ref 38–126)
ALT: 18 U/L (ref 17–63)
AST: 29 U/L (ref 15–41)
Albumin: 4.5 g/dL (ref 3.5–5.0)
Anion gap: 12 (ref 5–15)
BILIRUBIN TOTAL: 0.3 mg/dL (ref 0.3–1.2)
BUN: 9 mg/dL (ref 6–20)
CALCIUM: 8.6 mg/dL — AB (ref 8.9–10.3)
CO2: 25 mmol/L (ref 22–32)
Chloride: 106 mmol/L (ref 101–111)
Creatinine, Ser: 0.78 mg/dL (ref 0.61–1.24)
Glucose, Bld: 94 mg/dL (ref 65–99)
Potassium: 4.1 mmol/L (ref 3.5–5.1)
Sodium: 143 mmol/L (ref 135–145)
TOTAL PROTEIN: 7.9 g/dL (ref 6.5–8.1)

## 2017-12-05 LAB — ETHANOL: ALCOHOL ETHYL (B): 299 mg/dL — AB (ref <10)

## 2017-12-05 MED ORDER — TETANUS-DIPHTH-ACELL PERTUSSIS 5-2.5-18.5 LF-MCG/0.5 IM SUSP
0.5000 mL | Freq: Once | INTRAMUSCULAR | Status: AC
  Administered 2017-12-05: 0.5 mL via INTRAMUSCULAR
  Filled 2017-12-05: qty 0.5
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
## 2017-12-05 NOTE — ED Notes (Signed)
C-Collar applied by EMS prior to arrival.

## 2017-12-05 NOTE — Discharge Instructions (Signed)
The scan of your head, neck, chest, lower back and hip were reassuring. No broken bones or head bleed.   Please take ibuprofen as needed for back and hip pain.   Return to the ER for any new or worsening symptoms like headache with vomiting that will not stop, numbness, weakness, trouble with your vision.
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
## 2017-12-05 NOTE — ED Notes (Signed)
Bed: WTR6 Expected date:  Expected time:  Means of arrival:  Comments: 
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
## 2019-10-31 ENCOUNTER — Inpatient Hospital Stay (HOSPITAL_COMMUNITY)
Admission: EM | Admit: 2019-10-31 | Discharge: 2019-11-04 | DRG: 201 | Disposition: A | Discharge status: Home / Self Care | Payer: BLUE CROSS/BLUE SHIELD | Source: Ambulatory Visit | Attending: Thoracic Surgery (Cardiothoracic Vascular Surgery) | Admitting: Thoracic Surgery (Cardiothoracic Vascular Surgery)

## 2019-10-31 ENCOUNTER — Ambulatory Visit (HOSPITAL_COMMUNITY)
Admission: EM | Admit: 2019-10-31 | Discharge: 2019-10-31 | Disposition: A | Discharge status: Home / Self Care | Payer: BLUE CROSS/BLUE SHIELD | Source: Home / Self Care | Attending: Family Medicine | Admitting: Family Medicine

## 2019-10-31 ENCOUNTER — Encounter (HOSPITAL_COMMUNITY): Payer: Self-pay | Admitting: Emergency Medicine

## 2019-10-31 ENCOUNTER — Other Ambulatory Visit: Payer: Self-pay

## 2019-10-31 ENCOUNTER — Ambulatory Visit (INDEPENDENT_AMBULATORY_CARE_PROVIDER_SITE_OTHER): Payer: BLUE CROSS/BLUE SHIELD

## 2019-10-31 ENCOUNTER — Encounter (HOSPITAL_COMMUNITY): Payer: Self-pay

## 2019-10-31 ENCOUNTER — Emergency Department (HOSPITAL_COMMUNITY): Payer: BLUE CROSS/BLUE SHIELD

## 2019-10-31 DIAGNOSIS — J939 Pneumothorax, unspecified: Secondary | ICD-10-CM

## 2019-10-31 DIAGNOSIS — J9383 Other pneumothorax: Secondary | ICD-10-CM | POA: Diagnosis present

## 2019-10-31 DIAGNOSIS — N481 Balanitis: Secondary | ICD-10-CM | POA: Diagnosis present

## 2019-10-31 DIAGNOSIS — J438 Other emphysema: Secondary | ICD-10-CM | POA: Diagnosis present

## 2019-10-31 DIAGNOSIS — Z5320 Procedure and treatment not carried out because of patient's decision for unspecified reasons: Secondary | ICD-10-CM | POA: Diagnosis present

## 2019-10-31 DIAGNOSIS — R0602 Shortness of breath: Secondary | ICD-10-CM

## 2019-10-31 DIAGNOSIS — F17290 Nicotine dependence, other tobacco product, uncomplicated: Secondary | ICD-10-CM | POA: Diagnosis present

## 2019-10-31 DIAGNOSIS — Z809 Family history of malignant neoplasm, unspecified: Secondary | ICD-10-CM | POA: Diagnosis not present

## 2019-10-31 DIAGNOSIS — Z20822 Contact with and (suspected) exposure to covid-19: Secondary | ICD-10-CM | POA: Diagnosis present

## 2019-10-31 DIAGNOSIS — R5383 Other fatigue: Secondary | ICD-10-CM | POA: Diagnosis not present

## 2019-10-31 DIAGNOSIS — J9311 Primary spontaneous pneumothorax: Secondary | ICD-10-CM | POA: Diagnosis not present

## 2019-10-31 DIAGNOSIS — Z09 Encounter for follow-up examination after completed treatment for conditions other than malignant neoplasm: Secondary | ICD-10-CM

## 2019-10-31 LAB — COMPREHENSIVE METABOLIC PANEL
ALT: 17 U/L (ref 0–44)
AST: 19 U/L (ref 15–41)
Albumin: 4 g/dL (ref 3.5–5.0)
Alkaline Phosphatase: 86 U/L (ref 38–126)
Anion gap: 12 (ref 5–15)
BUN: 16 mg/dL (ref 6–20)
CO2: 26 mmol/L (ref 22–32)
Calcium: 9.6 mg/dL (ref 8.9–10.3)
Chloride: 103 mmol/L (ref 98–111)
Creatinine, Ser: 1.03 mg/dL (ref 0.61–1.24)
GFR calc Af Amer: >60 mL/min (ref >60)
GFR calc non Af Amer: >60 mL/min (ref >60)
Glucose, Bld: 64 mg/dL — ABNORMAL LOW (ref 70–99)
Potassium: 4.1 mmol/L (ref 3.5–5.1)
Sodium: 141 mmol/L (ref 135–145)
Total Bilirubin: 0.5 mg/dL (ref 0.3–1.2)
Total Protein: 7.3 g/dL (ref 6.5–8.1)

## 2019-10-31 LAB — CBC WITH DIFFERENTIAL/PLATELET
Abs Immature Granulocytes: 0.02 10*3/uL (ref 0.00–0.07)
Basophils Absolute: 0 10*3/uL (ref 0.0–0.1)
Basophils Relative: 0 %
Eosinophils Absolute: 0.2 10*3/uL (ref 0.0–0.5)
Eosinophils Relative: 3 %
HCT: 47.4 % (ref 39.0–52.0)
Hemoglobin: 15.4 g/dL (ref 13.0–17.0)
Immature Granulocytes: 0 %
Lymphocytes Relative: 30 %
Lymphs Abs: 2.2 10*3/uL (ref 0.7–4.0)
MCH: 32.2 pg (ref 26.0–34.0)
MCHC: 32.5 g/dL (ref 30.0–36.0)
MCV: 99 fL (ref 80.0–100.0)
Monocytes Absolute: 0.8 10*3/uL (ref 0.1–1.0)
Monocytes Relative: 10 %
Neutro Abs: 4 10*3/uL (ref 1.7–7.7)
Neutrophils Relative %: 57 %
Platelets: 288 10*3/uL (ref 150–400)
RBC: 4.79 MIL/uL (ref 4.22–5.81)
RDW: 13.9 % (ref 11.5–15.5)
WBC: 7.2 10*3/uL (ref 4.0–10.5)
nRBC: 0 % (ref 0.0–0.2)

## 2019-10-31 LAB — RESPIRATORY PANEL BY RT PCR (FLU A&B, COVID)
Influenza A by PCR: NEGATIVE
Influenza B by PCR: NEGATIVE
SARS Coronavirus 2 by RT PCR: NEGATIVE

## 2019-10-31 MED ORDER — POLYETHYLENE GLYCOL 3350 17 G PO PACK: 17.0000 g | PACK | Freq: Every day | ORAL | Status: DC | PRN

## 2019-10-31 MED ORDER — LIDOCAINE-EPINEPHRINE (PF) 2 %-1:200000 IJ SOLN: 10.0000 mL | Freq: Once | INTRAMUSCULAR | Status: AC

## 2019-10-31 MED ORDER — ONDANSETRON HCL 4 MG PO TABS: 4.0000 mg | ORAL_TABLET | Freq: Four times a day (QID) | ORAL | Status: DC | PRN

## 2019-10-31 MED ORDER — LIDOCAINE-EPINEPHRINE (PF) 2 %-1:200000 IJ SOLN
INTRAMUSCULAR | Status: AC
  Administered 2019-10-31: 10 mL via INTRADERMAL
  Filled 2019-10-31: qty 20

## 2019-10-31 MED ORDER — SODIUM CHLORIDE 0.9 % IV SOLN: 250.0000 mL | INTRAVENOUS | Status: DC | PRN

## 2019-10-31 MED ORDER — OXYCODONE HCL 5 MG PO TABS
5.0000 mg | ORAL_TABLET | ORAL | Status: DC | PRN
  Administered 2019-10-31 – 2019-11-04 (×9): 5 mg via ORAL
  Filled 2019-10-31 (×10): qty 1

## 2019-10-31 MED ORDER — FENTANYL CITRATE (PF) 100 MCG/2ML IJ SOLN: 12.5000 ug | INTRAMUSCULAR | Status: DC | PRN

## 2019-10-31 MED ORDER — ONDANSETRON HCL 4 MG/2ML IJ SOLN: 4.0000 mg | Freq: Four times a day (QID) | INTRAMUSCULAR | Status: DC | PRN

## 2019-10-31 MED ORDER — ACETAMINOPHEN 650 MG RE SUPP
650.0000 mg | Freq: Four times a day (QID) | RECTAL | Status: DC | PRN
  Filled 2019-10-31: qty 1

## 2019-10-31 MED ORDER — KETAMINE HCL 50 MG/5ML IJ SOSY
60.0000 mg | PREFILLED_SYRINGE | Freq: Once | INTRAMUSCULAR | Status: AC
  Administered 2019-10-31: 20 mg via INTRAVENOUS
  Filled 2019-10-31: qty 10

## 2019-10-31 MED ORDER — SODIUM CHLORIDE 0.9% FLUSH
3.0000 mL | Freq: Two times a day (BID) | INTRAVENOUS | Status: DC
  Administered 2019-10-31 – 2019-11-04 (×8): 3 mL via INTRAVENOUS

## 2019-10-31 MED ORDER — SODIUM CHLORIDE 0.9% FLUSH: 3.0000 mL | INTRAVENOUS | Status: DC | PRN

## 2019-10-31 MED ORDER — ACETAMINOPHEN 325 MG PO TABS
650.0000 mg | ORAL_TABLET | Freq: Four times a day (QID) | ORAL | Status: DC | PRN
  Administered 2019-11-02 – 2019-11-03 (×2): 650 mg via ORAL
  Filled 2019-10-31 (×2): qty 2

## 2019-10-31 MED ORDER — SODIUM CHLORIDE 0.9% FLUSH
10.0000 mL | Freq: Three times a day (TID) | INTRAVENOUS | Status: DC
Start: 2019-10-31 — End: 2019-11-04
  Administered 2019-10-31 – 2019-11-04 (×11): 10 mL

## 2019-10-31 NOTE — ED Provider Notes (Addendum)
[facility] EMERGENCY DEPARTMENT Provider Note   CSN: [id number] Arrival date & time: 10/31/19  1146     History Chief Complaint  Patient presents with  . Chest Injury    Victor Jefferson is a 44 y.o. male with a past medical history of 4 previous spontaneous pneumothorax.  He presented at the urgent care earlier today.  He states that he went in because he was having some penile irritation and felt he had balanitis secondary to a latex allergy.  He states that while he was there he also complained of some shortness of breath for the past 5 days.  Patient states that he thought he might of had a small pneumothorax but it seemed to get better although yesterday when he was lifting some boxes he felt like he was going to pass out.  He denies any chest pain or significant shortness of breath.  Patient states that he has not smoked in the past 5 days but he otherwise is a daily smoker although he is thinking about quitting today.  He denies cough, chest pain.  HPI     Past Medical History:  Diagnosis Date  . Eczema   . Spontaneous pneumothorax     Patient Active Problem List   Diagnosis Date Noted  . S/P lung surgery, follow-up exam 08/13/2015  . Pneumothorax on right 08/10/2015    Past Surgical History:  Procedure Laterality Date  . MASS EXCISION Right 09/13/2016   Procedure: EXCISION OF RIGHT CHEEK SEBACEOUS CYST;  Surgeon: Loel Lofty Dillingham, DO;  Location: [place];  Service: Plastics;  Laterality: Right;  . STAPLING OF BLEBS Right 08/13/2015   Procedure: BLEBECTOMY;  Surgeon: Melrose Nakayama, MD;  Location: [facility];  Service: Thoracic;  Laterality: Right;  [name] VIDEO ASSISTED THORACOSCOPY Right 08/13/2015   Procedure: VIDEO ASSISTED THORACOSCOPY, plerual abrasion;  Surgeon: Melrose Nakayama, MD;  Location: [place];  Service: Thoracic;  Laterality: Right;       Family History  Problem Relation Age of Onset  . Healthy Mother   . Cancer Father     Social  History   Tobacco Use  . Smoking status: Current Every Day Smoker    Years: 18.00    Types: E-cigarettes  . Smokeless tobacco: Never Used  Substance Use Topics  . Alcohol use: Yes    Alcohol/week: 6.0 standard drinks    Types: 6 Cans of beer per week  . Drug use: No    Home Medications Prior to Admission medications   Medication Sig Start Date End Date Taking? Authorizing Provider  acetaminophen (TYLENOL) 325 MG tablet Take 650 mg by mouth every 6 (six) hours as needed.    [provider]  HYDROcodone-acetaminophen (NORCO/VICODIN) 5-325 MG tablet 1 or 2 tabs PO q6 hours prn pain Patient not taking: Reported on 12/05/2017 09/05/16   Lily Kocher, PA-C  ibuprofen (ADVIL,MOTRIN) 200 MG tablet Take 400 mg by mouth every 4 (four) hours as needed for moderate pain.    [provider]    Allergies    Charentais melon (french melon)  Review of Systems   Review of Systems Ten systems reviewed and are negative for acute change, except as noted in the HPI.   Physical Exam Updated Vital Signs BP 120/84   Pulse 64   Temp 98.4 F (36.9 C) (Oral)   Resp 19   Ht 5\' 11"  (1.803 m)   Wt 61.2 kg   SpO2 98%   BMI 18.83 kg/m  Physical Exam Vitals and nursing note reviewed.  Constitutional:      General: He is not in acute distress.    Appearance: He is well-developed. He is not diaphoretic.  HENT:     Head: Normocephalic and atraumatic.  Eyes:     General: No scleral icterus.    Conjunctiva/sclera: Conjunctivae normal.  Cardiovascular:     Rate and Rhythm: Normal rate and regular rhythm.     Heart sounds: Normal heart sounds.  Pulmonary:     Effort: Pulmonary effort is normal. No respiratory distress.     Breath sounds: Normal breath sounds.    Abdominal:     Palpations: Abdomen is soft.     Tenderness: There is no abdominal tenderness.  Musculoskeletal:     Cervical back: Normal range of motion and neck supple.  Skin:    General: Skin is warm and dry.    Neurological:     Mental Status: He is alert.  Psychiatric:        Behavior: Behavior normal.     ED Results / Procedures / Treatments   Labs (all labs ordered are listed, but only abnormal results are displayed) Labs Reviewed  RESPIRATORY PANEL BY RT PCR (FLU A&B, COVID)  COMPREHENSIVE METABOLIC PANEL  CBC WITH DIFFERENTIAL/PLATELET    EKG EKG Interpretation  Date/Time:  Wednesday Oct 31 2019 12:06:09 EDT Ventricular Rate:  60 PR Interval:  120 QRS Duration: 80 QT Interval:  402 QTC Calculation: 402 R Axis:   -50 Text Interpretation: Normal sinus rhythm Right atrial enlargement Indeterminate axis Pulmonary disease pattern Abnormal QRS-T angle, consider primary T wave abnormality Abnormal ECG Confirmed by Kennis Carina [phone]) on 10/31/2019 1:56:10 PM   Radiology DG Chest 2 View  Result Date: 10/31/2019 CLINICAL DATA:  Shortness of breath and fatigue. EXAM: CHEST - 2 VIEW COMPARISON:  12/05/2017 FINDINGS: Very large right pneumothorax. Mild leftward mediastinal shift when compared with prior study. Postoperative changes seen to the right upper lobe. Paraseptal emphysema at both apices. Trace right pleural fluid. Normal heart size. Critical Value/emergent results were called by telephone at the time of interpretation on 10/31/2019 at 11:44 am to provider [facility] , who is already aware. IMPRESSION: 1. Large right pneumothorax with mild mediastinal shift. 2. Biapical paraseptal emphysema with remote right upper lobe surgery. Electronically Signed   By: Marnee Spring M.D.   On: 10/31/2019 11:45    Procedures CHEST TUBE INSERTION  Date/Time: 10/31/2019 3:37 PM Performed by: Arthor Captain, PA-C Authorized by: Arthor Captain, PA-C   Consent:    Consent obtained:  Verbal and written   Consent given by:  Patient   Alternatives discussed:  No treatment, delayed treatment and alternative treatment Pre-procedure details:    Skin preparation:  ChloraPrep   Preparation: Patient  was prepped and draped in the usual sterile fashion   Procedure details:    Placement location:  R lateral   Scalpel size:  11   Tube size ([place]): cook pigtail catheter.   Ultrasound guidance: no     Tension pneumothorax: no     Tube connected to:  Suction   Drainage characteristics:  Bloody   Suture material:  2-0 silk   Dressing:  4x4 sterile gauze Post-procedure details:    Post-insertion x-ray findings: tube in good position     Patient tolerance of procedure:  Tolerated well, no immediate complications  .Critical Care Performed by: Arthor Captain, PA-C Authorized by: Arthor Captain, PA-C   Critical care provider statement:  Critical care time (minutes):  40   Critical care time was exclusive of:  Separately billable procedures and treating other patients   Critical care was necessary to treat or prevent imminent or life-threatening deterioration of the following conditions:  Respiratory failure   Critical care was time spent personally by me on the following activities:  Discussions with consultants, evaluation of patient's response to treatment, examination of patient, ordering and performing treatments and interventions, ordering and review of laboratory studies, ordering and review of radiographic studies, pulse oximetry, re-evaluation of patient's condition, obtaining history from patient or surrogate and review of old charts   (including critical care time)  Medications Ordered in ED Medications  sodium chloride flush (NS) 0.9 % injection 10 mL (10 mLs Intracatheter Given 10/31/19 1440)  ketamine 50 mg in normal saline 5 mL (10 mg/mL) syringe (has no administration in time range)  lidocaine-EPINEPHrine (XYLOCAINE W/EPI) 2 %-1:200000 (PF) injection (has no administration in time range)    ED Course  I have reviewed the triage vital signs and the nursing notes.  Pertinent labs & imaging results that were available during my care of the patient were reviewed by me and  considered in my medical decision making (see chart for details).    MDM Rules/Calculators/A&P                      This is a 44 year old male with a history of pneumothoraces in the past.  He states he has had at least 4.  In he had a right-sided VATS and blebectomy and the patient past by Dr. Dorris Fetch.  The patient will be admitted by CT surgery.  I have placed this chest tube and he is feeling greatly improved after procedure.  I personally ordered and reviewed the repeat chest x-ray which shows no evidence of pneumothorax, the pigtail catheter appears well-placed and the lung is nearly completely reinflated. Final Clinical Impression(s) / ED Diagnoses Final diagnoses:  Pneumothorax on right  Balanitis    Rx / DC Orders ED Discharge Orders    None       Arthor Captain, PA-C 10/31/19 1623    Arthor Captain, PA-C 10/31/19 1624    Sabas Sous, MD 11/02/19 1535    Arthor Captain, PA-C 11/15/19 1800    Sabas Sous, MD 11/17/19 1245

## 2019-10-31 NOTE — Plan of Care (Signed)
  Problem: Education: Goal: Knowledge of General Education information will improve Description: Including pain rating scale, medication(s)/side effects and non-pharmacologic comfort measures Outcome: Progressing   Problem: Clinical Measurements: Goal: Respiratory complications will improve Outcome: Progressing   Problem: Nutrition: Goal: Adequate nutrition will be maintained Outcome: Progressing   Problem: Pain Managment: Goal: General experience of comfort will improve Outcome: Progressing   Problem: Safety: Goal: Ability to remain free from injury will improve Outcome: Progressing   

## 2019-10-31 NOTE — ED Notes (Signed)
Patient is being discharged from the Urgent Care Center and sent to the Emergency Department via POV and family. Per Dr. Tracie Harrier, patient is stable but in need of higher level of care due to spontaneous pneumothorax, SOB. Patient is aware and verbalizes understanding of plan of care.  Vitals:   10/31/19 1040  BP: 126/88  Pulse: 72  Resp: 16  Temp: 97.9 F (36.6 C)  SpO2: 98%

## 2019-10-31 NOTE — ED Notes (Signed)
Informed consent at bedside °

## 2019-10-31 NOTE — ED Notes (Signed)
Family at bedside. 

## 2019-10-31 NOTE — ED Provider Notes (Signed)
[place]   [id number] 10/31/19 Arrival Time: 9518  ASSESSMENT & PLAN:  1. Pneumothorax, right   2. SOB (shortness of breath)     I have personally viewed the imaging studies ordered this visit. Large R-sided pneumothorax.  To ED via private vehicle. Stable upon discharge. ED notified via RN.   Reviewed expectations re: course of current medical issues. Questions answered. Outlined signs and symptoms indicating need for more acute intervention. Patient verbalized understanding. After Visit Summary given.   SUBJECTIVE:  History from: patient. Victor Jefferson is a 44 y.o. male who report abrupt R-sided chest discomfort approx 5 days ago. Mild DOE; better with rest. Also with mild nasal congestion and mild cough. Afebrile. No significant changes in symptoms since onset. No associated n/v. Ambulatory without difficulty. No abdominal or back pain. H/O spontaneous pneumothorax.  Social History   Tobacco Use  Smoking Status Current Every Day Smoker  . Years: 18.00  . Types: E-cigarettes  Smokeless Tobacco Never Used   No illicit drug use reported.  Social History   Substance and Sexual Activity  Alcohol Use Yes  . Alcohol/week: 6.0 standard drinks  . Types: 6 Cans of beer per week      OBJECTIVE:  Vitals:   10/31/19 1040  BP: 126/88  Pulse: 72  Resp: 16  Temp: 97.9 F (36.6 C)  TempSrc: Oral  SpO2: 98%    General appearance: alert, oriented, no acute distress Eyes: PERRLA; EOMI; conjunctivae normal HENT: normocephalic; atraumatic Neck: supple with FROM Lungs: without labored respirations; speaks full sentences without difficulty; diminished lungs sound on the R Heart: regular rate and rhythm Chest Wall: without tenderness to palpation Abdomen: soft, non-tender; no guarding or rebound tenderness Extremities: without edema; without calf swelling or tenderness; symmetrical without gross deformities Skin: warm and dry; without rash or lesions Neuro:  normal gait Psychological: alert and cooperative; normal mood and affect   Imaging: DG Chest 2 View  Result Date: 10/31/2019 CLINICAL DATA:  Shortness of breath and fatigue. EXAM: CHEST - 2 VIEW COMPARISON:  12/05/2017 FINDINGS: Very large right pneumothorax. Mild leftward mediastinal shift when compared with prior study. Postoperative changes seen to the right upper lobe. Paraseptal emphysema at both apices. Trace right pleural fluid. Normal heart size. Critical Value/emergent results were called by telephone at the time of interpretation on 10/31/2019 at 11:44 am to provider [facility] , who is already aware. IMPRESSION: 1. Large right pneumothorax with mild mediastinal shift. 2. Biapical paraseptal emphysema with remote right upper lobe surgery. Electronically Signed   By: Monte Fantasia M.D.   On: 10/31/2019 11:45     Allergies  Allergen Reactions  . 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 Eczema   . Spontaneous pneumothorax    Social History   Socioeconomic History  . Marital status: Single    Spouse name: Not on file  . Number of children: Not on file  . Years of education: Not on file  . Highest education level: Not on file  Occupational History  . Not on file  Tobacco Use  . Smoking status: Current Every Day Smoker    Years: 18.00    Types: E-cigarettes  . Smokeless tobacco: Never Used  Substance and Sexual Activity  . Alcohol use: Yes    Alcohol/week: 6.0 standard drinks    Types: 6 Cans of beer per week  . Drug use: No  .  Sexual activity: Not on file  Other Topics Concern  . Not on file  Social History Narrative  . Not on file   Social Determinants of Health   Financial Resource Strain:   . Difficulty of Paying Living Expenses:   Food Insecurity:   . Worried About Programme researcher, broadcasting/film/video in the Last Year:   . Barista in the Last Year:   Transportation Needs:   . Automotive engineer (Medical):   [name] Lack of Transportation (Non-Medical):   Physical Activity:   . Days of Exercise per Week:   . Minutes of Exercise per Session:   Stress:   . Feeling of Stress :   Social Connections:   . Frequency of Communication with Friends and Family:   . Frequency of Social Gatherings with Friends and Family:   . Attends Religious Services:   . Active Member of Clubs or Organizations:   . Attends Banker Meetings:   [name] Marital Status:   Intimate Partner Violence:   . Fear of Current or Ex-Partner:   . Emotionally Abused:   [name] Physically Abused:   . Sexually Abused:    Family History  Problem Relation Age of Onset  . Healthy Mother   . Cancer Father    Past Surgical History:  Procedure Laterality Date  . MASS EXCISION Right 09/13/2016   Procedure: EXCISION OF RIGHT CHEEK SEBACEOUS CYST;  Surgeon: Alena Bills Dillingham, DO;  Location: [facility] OR;  Service: Plastics;  Laterality: Right;  . STAPLING OF BLEBS Right 08/13/2015   Procedure: BLEBECTOMY;  Surgeon: Loreli Slot, MD;  Location: [facility] OR;  Service: Thoracic;  Laterality: Right;  [name] VIDEO ASSISTED THORACOSCOPY Right 08/13/2015   Procedure: VIDEO ASSISTED THORACOSCOPY, plerual abrasion;  Surgeon: Loreli Slot, MD;  Location: [facility] OR;  Service: Thoracic;  Laterality: Right;     Mardella Layman, MD 10/31/19 1228

## 2019-10-31 NOTE — ED Triage Notes (Addendum)
Pt c/o acute onset runny nose, congestion, non-productive cough, SOB, fatigue onset Friday. Denies fever, chills, abdom pain, n/v/d, body aches or sore throat, ear pain.   Pt states "it felt like an air pocket popped in my right chest" on Friday, but has improved. Denies CP.  Slightly diminished BS to right lung fields. SPO2 97% on RA, able to speak full sentences w/o difficulty. Pt declines COVID test at this time, and requests to speak with medical provider first.

## 2019-10-31 NOTE — ED Notes (Signed)
Dinner ordered 

## 2019-10-31 NOTE — ED Triage Notes (Signed)
Patient sent from [facility] for pneumothorax. Having shortness of breath x 4 days. Patient speaking in full sentences and in NAD.

## 2019-10-31 NOTE — H&P (Addendum)
[address]            [place] [address]          [phone]     Victor Jefferson is an 44 y.o. male. 09-Dec-1975 YOM:[id number]   Chief Complaint: Non productive cough, shortness of breath,  congestion Reason for admission: Spontaneous right pneumothorax, s/p right chest tube placement  History of Presenting Illness:  This is a 44 year old male with a past medical history of bilateral spontaneous pneumothoraces, previously known to Dr. Roxan Hockey as patient has had a right vats, bleb ectomy, and pleural abrasion on 08/13/2015. According to the patient about 4-5 days ago, he had acute onset of congestion, non productive cough, fatigue, and shortness of breath. He denied fever, chills, abdominal pain, nausea or vomiting. He also stated he fell in the bathroom and hit the right side of back. He denies LOC or syncope. He presented to [facility] earlier today. Chest x ray showed a large right pneumothorax with leftward mediastinal shift and biapical paraseptal emphysema. He was urgently transferred to [facility] ED for further evaluation and treatment. A right chest tube was placed and follow up chest x ray showed no residual pneumothorax and lungs a clear. Of note, patient states he is still smoking tobacco daily, but it may have been a few days since he last smoked. He again vows to quit. Dr. Roxan Hockey has been asked to admit the patient and manage chest tube. Patient is no acute distress, vital signs are stable, and chest tube is to suction and there is no air leak.  Past Medical History: Past Medical History:  Diagnosis Date  . Eczema   . Spontaneous pneumothorax     Past Surgical History: Past Surgical History:  Procedure Laterality Date  . MASS EXCISION Right 09/13/2016   Procedure: EXCISION OF RIGHT CHEEK SEBACEOUS CYST;  Surgeon: Loel Lofty Dillingham, DO;  Location: [place];  Service: Plastics;  Laterality: Right;  . STAPLING OF BLEBS Right 08/13/2015    Procedure: BLEBECTOMY;  Surgeon: Melrose Nakayama, MD;  Location: [place];  Service: Thoracic;  Laterality: Right;  [name] VIDEO ASSISTED THORACOSCOPY Right 08/13/2015   Procedure: VIDEO ASSISTED THORACOSCOPY, plerual abrasion;  Surgeon: Melrose Nakayama, MD;  Location: [facility] OR;  Service: Thoracic;  Laterality: Right;    Family History: Family History  Problem Relation Age of Onset  . Healthy Mother   . Cancer Father      Social History:   reports that he has been smoking e-cigarettes. He has smoked for the past 18.00 years. He has never used smokeless tobacco. He reports current alcohol use of about 6.0 standard drinks of alcohol per week. He reports that he does not use drugs. Patient is employed as a Patent attorney  Allergies:  Allergies  Allergen Reactions  . 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     Review of Systems:  Cardiac Review of Systems: Y or N  Chest Pain [ N ]  Exertional SOB [ Y on admission ]  Pedal Edema Aqua.Slicker ] Palpitations Aqua.Slicker ] Syncope Aqua.Slicker ] Presyncope [ N ]  General Review of Systems: [Y] = yes [N ]=no  Constitional: fatigue [Y ]; nausea Aqua.Slicker ]; night sweats [ ] ; fever Aqua.Slicker ]; or chills Aqua.Slicker ];   Eye : blurred vision [ N]; Amaurosis fugax[N ];  Resp: cough [ Y]; wheezing[N ]; hemoptysis[N ];  GI: vomiting[N ]; Heme/Lymph: bruising[N ];anemia[N ];  Neuro: TIA[ N]; headaches[Y ]; stroke[ N]; vertigo[N ]; seizures[N ]; Endocrine: diabetes[N ];   BP (!) 130/108   Pulse 73   Temp 98.4 F (36.9 C) (Oral)   Resp 18   Ht 5\' 11"  (1.803 m)   Wt 61.2 kg   SpO2 100%   BMI 18.83 kg/m   Physical Exam: General appearance: alert, cooperative and no distress Neurologic: intact Heart: RRR Lungs: clear to auscultation bilaterally.  Abdomen: Soft, non tender, bowel sounds present Extremities: No LE edmea  Diagnostic Studies and Laboratory Results: No results found for this or any previous visit (from the past 48 hour(s)). DG Chest 2  View  Result Date: 10/31/2019 CLINICAL DATA:  Shortness of breath and fatigue. EXAM: CHEST - 2 VIEW COMPARISON:  12/05/2017 FINDINGS: Very large right pneumothorax. Mild leftward mediastinal shift when compared with prior study. Postoperative changes seen to the right upper lobe. Paraseptal emphysema at both apices. Trace right pleural fluid. Normal heart size. Critical Value/emergent results were called by telephone at the time of interpretation on 10/31/2019 at 11:44 am to provider [facility] , who is already aware. IMPRESSION: 1. Large right pneumothorax with mild mediastinal shift. 2. Biapical paraseptal emphysema with remote right upper lobe surgery. Electronically Signed   By: [facility] M.D.   On: 10/31/2019 11:45   DG Chest Portable 1 View  Result Date: 10/31/2019 CLINICAL DATA:  Post chest tube placement. EXAM: PORTABLE CHEST 1 VIEW COMPARISON:  Chest x-ray from earlier same day. FINDINGS: RIGHT-sided chest tube has been placed. No residual pneumothorax identified. Lungs are clear. No pleural effusion is seen. Heart size and mediastinal contours are within normal limits and in the midline. IMPRESSION: RIGHT-sided chest tube placement. No residual pneumothorax identified. Electronically Signed   By: 12/31/2019 M.D.   On: 10/31/2019 15:41     Assessment/Plan 1. Spontaneous right pneumothorax, s/p right vats, bleb ectomy, and pleural abrasion in 2017.S/p right chest tube with re expansion of lung. Chest tube to remain to suction for now. Check CXR in am. 2. Tobacco use-counseled patient on importance of cessation  2018, PA-C 10/31/2019, 4:08 PM  Patient seen and examined, agree with above Presented with 5 day history of cough, SOB, fatigue. Large pneumothorax. ED placed pigtail with near complete reexpansion of lung.  Redo VATS is best option Will order CT to evaluate anatomy  12/31/2019 C. Viviann Spare, MD [facility] [phone]

## 2019-11-01 ENCOUNTER — Encounter (HOSPITAL_COMMUNITY): Payer: Self-pay | Admitting: Certified Registered Nurse Anesthetist

## 2019-11-01 ENCOUNTER — Inpatient Hospital Stay (HOSPITAL_COMMUNITY): Payer: BLUE CROSS/BLUE SHIELD

## 2019-11-01 DIAGNOSIS — J939 Pneumothorax, unspecified: Secondary | ICD-10-CM

## 2019-11-01 LAB — URINALYSIS, ROUTINE W REFLEX MICROSCOPIC
Bilirubin Urine: NEGATIVE
Glucose, UA: NEGATIVE mg/dL
Hgb urine dipstick: NEGATIVE
Ketones, ur: NEGATIVE mg/dL
Leukocytes,Ua: NEGATIVE
Nitrite: NEGATIVE
Protein, ur: NEGATIVE mg/dL
Specific Gravity, Urine: 1.02 (ref 1.005–1.030)
pH: 6 (ref 5.0–8.0)

## 2019-11-01 LAB — CBC
HCT: 45.9 % (ref 39.0–52.0)
Hemoglobin: 15.2 g/dL (ref 13.0–17.0)
MCH: 31.9 pg (ref 26.0–34.0)
MCHC: 33.1 g/dL (ref 30.0–36.0)
MCV: 96.2 fL (ref 80.0–100.0)
Platelets: 247 10*3/uL (ref 150–400)
RBC: 4.77 MIL/uL (ref 4.22–5.81)
RDW: 13.6 % (ref 11.5–15.5)
WBC: 13.7 10*3/uL — ABNORMAL HIGH (ref 4.0–10.5)
nRBC: 0 % (ref 0.0–0.2)

## 2019-11-01 LAB — COMPREHENSIVE METABOLIC PANEL
ALT: 12 U/L (ref 0–44)
AST: 15 U/L (ref 15–41)
Albumin: 3.4 g/dL — ABNORMAL LOW (ref 3.5–5.0)
Alkaline Phosphatase: 62 U/L (ref 38–126)
Anion gap: 7 (ref 5–15)
BUN: 12 mg/dL (ref 6–20)
CO2: 24 mmol/L (ref 22–32)
Calcium: 8.8 mg/dL — ABNORMAL LOW (ref 8.9–10.3)
Chloride: 104 mmol/L (ref 98–111)
Creatinine, Ser: 0.81 mg/dL (ref 0.61–1.24)
GFR calc Af Amer: >60 mL/min (ref >60)
GFR calc non Af Amer: >60 mL/min (ref >60)
Glucose, Bld: 112 mg/dL — ABNORMAL HIGH (ref 70–99)
Potassium: 4.3 mmol/L (ref 3.5–5.1)
Sodium: 135 mmol/L (ref 135–145)
Total Bilirubin: 0.8 mg/dL (ref 0.3–1.2)
Total Protein: 6.4 g/dL — ABNORMAL LOW (ref 6.5–8.1)

## 2019-11-01 LAB — PROTIME-INR
INR: 1 (ref 0.8–1.2)
Prothrombin Time: 13 seconds (ref 11.4–15.2)

## 2019-11-01 LAB — TYPE AND SCREEN
ABO/RH(D): O POS
Antibody Screen: NEGATIVE

## 2019-11-01 LAB — APTT: aPTT: 33 seconds (ref 24–36)

## 2019-11-01 MED ORDER — CEFAZOLIN SODIUM-DEXTROSE 2-4 GM/100ML-% IV SOLN: 2.0000 g | INTRAVENOUS | Status: DC

## 2019-11-01 NOTE — Progress Notes (Signed)
Went down for CT scan of the chest by bed awake and alert. Stable.

## 2019-11-01 NOTE — Anesthesia Preprocedure Evaluation (Deleted)
Anesthesia Evaluation    Reviewed: Allergy & Precautions, Patient's Chart, lab work & pertinent test results  History of Anesthesia Complications Negative for: history of anesthetic complications  Airway        Dental   Pulmonary Current Smoker,  Blebs          Cardiovascular negative cardio ROS       Neuro/Psych negative neurological ROS     GI/Hepatic negative GI ROS, Neg liver ROS,   Endo/Other  negative endocrine ROS  Renal/GU negative Renal ROS     Musculoskeletal negative musculoskeletal ROS (+)   Abdominal   Peds  Hematology negative hematology ROS (+)   Anesthesia Other Findings Day of surgery medications reviewed with the patient.  Reproductive/Obstetrics                             Anesthesia Physical Anesthesia Plan  ASA: III  Anesthesia Plan: General   Post-op Pain Management:    Induction: Intravenous  PONV Risk Score and Plan: 2 and Ondansetron and Dexamethasone  Airway Management Planned: Double Lumen EBT  Additional Equipment: Arterial line  Intra-op Plan:   Post-operative Plan: Extubation in OR  Informed Consent:   Plan Discussed with: Anesthesiologist  Anesthesia Plan Comments:        Anesthesia Quick Evaluation

## 2019-11-01 NOTE — Progress Notes (Signed)
encouraged to ambulate along the hallway still refused due to pain. Medicated with pain pill.

## 2019-11-01 NOTE — Progress Notes (Signed)
Offered to ambulate along the hallway but claimed to do it later.  Explained the significance of ambulation.

## 2019-11-01 NOTE — Progress Notes (Addendum)
[address]       [name] [address]             [phone]        Procedure(s) (LRB): REDO VIDEO ASSISTED THORACOSCOPY (Right) STAPLING OF BLEBS, PLEURAL ABRASION (Right) Subjective: Breathing is comfortable  Objective: Vital signs in last 24 hours: Temp:  [97.9 F (36.6 C)-99.3 F (37.4 C)] 99.3 F (37.4 C) (05/06 0342) Pulse Rate:  [58-93] 66 (05/06 0342) Cardiac Rhythm: Sinus bradycardia (05/06 0700) Resp:  [14-25] 20 (05/06 0342) BP: (103-172)/(67-108) 104/76 (05/06 0342) SpO2:  [92 %-100 %] 98 % (05/06 0342) Weight:  [61.2 kg] 61.2 kg (05/05 1204)  Hemodynamic parameters for last 24 hours:    Intake/Output from previous day: 05/05 0701 - 05/06 0700 In: 260 [P.O.:240; I.V.:20] Out: 836 [Urine:800; Chest Tube:36] Intake/Output this shift: No intake/output data recorded.  General appearance: alert, cooperative and no distress Heart: regular rate and rhythm and brady Lungs: fair air exchange bilat Abdomen: benign  Lab Results: Recent Labs    10/31/19 1527  WBC 7.2  HGB 15.4  HCT 47.4  PLT 288   BMET:  Recent Labs    10/31/19 1527  NA 141  K 4.1  CL 103  CO2 26  GLUCOSE 64*  BUN 16  CREATININE 1.03  CALCIUM 9.6    PT/INR: No results for input(s): LABPROT, INR in the last 72 hours. ABG No results found for: PHART, HCO3, TCO2, ACIDBASEDEF, O2SAT CBG (last 3)  No results for input(s): GLUCAP in the last 72 hours.  Meds Scheduled Meds: . sodium chloride flush  10 mL Intracatheter Q8H  . sodium chloride flush  3 mL Intravenous Q12H   Continuous Infusions: . sodium chloride     PRN Meds:.sodium chloride, acetaminophen **OR** acetaminophen, fentaNYL (SUBLIMAZE) injection, ondansetron **OR** ondansetron (ZOFRAN) IV, oxyCODONE, polyethylene glycol, sodium chloride flush  Xrays DG Chest 2 View  Result Date: 10/31/2019 CLINICAL DATA:  Shortness of breath and fatigue. EXAM: CHEST - 2 VIEW COMPARISON:  12/05/2017 FINDINGS:  Very large right pneumothorax. Mild leftward mediastinal shift when compared with prior study. Postoperative changes seen to the right upper lobe. Paraseptal emphysema at both apices. Trace right pleural fluid. Normal heart size. Critical Value/emergent results were called by telephone at the time of interpretation on 10/31/2019 at 11:44 am to provider [facility] , who is already aware. IMPRESSION: 1. Large right pneumothorax with mild mediastinal shift. 2. Biapical paraseptal emphysema with remote right upper lobe surgery. Electronically Signed   By: Marnee Spring M.D.   On: 10/31/2019 11:45   DG Chest Portable 1 View  Result Date: 10/31/2019 CLINICAL DATA:  Post chest tube placement. EXAM: PORTABLE CHEST 1 VIEW COMPARISON:  Chest x-ray from earlier same day. FINDINGS: RIGHT-sided chest tube has been placed. No residual pneumothorax identified. Lungs are clear. No pleural effusion is seen. Heart size and mediastinal contours are within normal limits and in the midline. IMPRESSION: RIGHT-sided chest tube placement. No residual pneumothorax identified. Electronically Signed   By: Bary Richard M.D.   On: 10/31/2019 15:41    Assessment/Plan: S/P Procedure(s) (LRB): REDO VIDEO ASSISTED THORACOSCOPY (Right) STAPLING OF BLEBS, PLEURAL ABRASION (Right)  1 stable with CT in place, small apical pntx on CXR- keep to current suction for now 2 afebrile , VSS with some bradycardia in 50's and occas systolic HTN- mostly well controlled   LOS: 1 day  No air leak at present but there still is an apical  pneumothorax. I think best option is to do a redo right VATS for stapling of blebs, pleural abrasion. I informed him of the general nature of the procedure which he has had previously. He understands this significantly decreases but does not totally eliminate the possibility of a recurrence. I informed him of the indications, risks, benefits and alternatives. He understands the risks include but are not limited to  death, MI, DVT. PE, bleeding, possible need for transfusion, infection, prolonged air leak, as well as the possibility of other unforeseeable complications.  He will think over his options and decide after talking with his family  Revonda Standard. Roxan Hockey, MD [facility] [phone]   John Giovanni PA-C Pager [phone] 11/01/2019

## 2019-11-02 ENCOUNTER — Encounter (HOSPITAL_COMMUNITY)
Admission: EM | Disposition: A | Discharge status: Home / Self Care | Payer: Self-pay | Source: Home / Self Care | Attending: Thoracic Surgery (Cardiothoracic Vascular Surgery)

## 2019-11-02 SURGERY — VIDEO ASSISTED THORACOSCOPY
Anesthesia: General | Site: Chest | Laterality: Right

## 2019-11-02 NOTE — Discharge Summary (Addendum)
Physician Discharge Summary  Patient ID: Victor Jefferson MRN: [id number] DOB/AGE: 42-Jul-1977 79 y.o.  Admit date: 10/31/2019 Discharge date: 11/04/2019  Admission Diagnoses: Recurrent spontaneous pneumothorax (right)  Discharge Diagnoses:  Active Problems:   Recurrent spontaneous pneumothorax  Patient Active Problem List   Diagnosis Date Noted  . Recurrent spontaneous pneumothorax 10/31/2019  . S/P lung surgery, follow-up exam 08/13/2015  . Pneumothorax on right 08/10/2015   History of Presenting Illness: t time of consultation/admission This is a 44 year old male with a past medical history of bilateral spontaneous pneumothoraces, previously known to Victor Jefferson as patient has had a right vats, bleb ectomy, and pleural abrasion on 08/13/2015. According to the patient about 4-5 days ago, he had acute onset of congestion, non productive cough, fatigue, and shortness of breath. He denied fever, chills, abdominal pain, nausea or vomiting. He also stated he fell in the bathroom and hit the right side of back. He denies LOC or syncope. He presented to [facility] earlier today. Chest x ray showed a large right pneumothorax with leftward mediastinal shift and biapical paraseptal emphysema. He was urgently transferred to [facility] ED for further evaluation and treatment. A right chest tube was placed and follow up chest x ray showed no residual pneumothorax and lungs a clear. Of note, patient states he is still smoking tobacco daily, but it may have been a few days since he last smoked. He again vows to quit. Victor Jefferson has been asked to admit the patient and manage chest tube. Patient is no acute distress, vital signs are stable, and chest tube is to suction and there is no air leak.  The patient was admitted for further evaluation and treatment.  Discharged Condition: good  Hospital Course: Patient was admitted for further evaluation and management of his chest tube.  A CT scan was obtained  and this showed multiple right upper lobe blebs with minimal residual pneumothorax.  It also showed some groundglass opacities in the right middle lobe that which will need follow-up in 3 to 6 months.  It was Dr. Leonarda Salon recommendation that the patient should have repeat video-assisted thoracoscopy for bleb stapling and initially the patient agreed.  He however did change his mind and has decided against surgery at this time.  His chest x-ray has remained stable in appearance and tube was removed on 11/04/2019.  A repeat chest x-ray following this revealed a stable in appearance of a small pneumothorax on the right.  The patient understands he is at risk for recurrence and possible need for surgery in the future but he is wanting to be discharged at this time.  We will arrange follow-up with Victor Jefferson in 1 week's time.  If he has recurrent issues he knows to return to the emergency department.  Consults: None  Significant Diagnostic Studies: chest CT scan  Treatments: chest tube (pigtail)  Discharge Exam: Blood pressure (!) 112/99, pulse 69, temperature 98 F (36.7 C), temperature source Oral, resp. rate 12, height 5\' 11"  (1.803 m), weight 61.2 kg, SpO2 99 %.   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      Follow-up Information    Loreli Slot, MD Follow up.   Specialty: Cardiothoracic Surgery Why: Please see discharge paperwork for follow-up appointment with surgeon.  Also obtain a chest x-ray at [facility] 1/2-hour prior to this appointment.  It is located in the same office complex. Contact information: [address] [place] [address] [phone]           Signed: Rowe Clack PA-C 11/04/2019, 12:54 PM

## 2019-11-02 NOTE — Progress Notes (Signed)
Ambulated along the hallway about 500 feet assisted by RN tolerated well. With minimal pain on the incision site.continue to monitor.

## 2019-11-02 NOTE — Progress Notes (Signed)
Day of Surgery Procedure(s) (LRB): REDO VIDEO ASSISTED THORACOSCOPY (Right) STAPLING OF BLEBS, PLEURAL ABRASION (Right) Subjective: No complaints, but does not want to have surgery  Objective: Vital signs in last 24 hours: Temp:  [98.1 F (36.7 C)-100.2 F (37.9 C)] 98.1 F (36.7 C) (05/07 0334) Pulse Rate:  [62-88] 63 (05/07 0334) Cardiac Rhythm: Normal sinus rhythm (05/07 0719) Resp:  [17-24] 19 (05/07 0334) BP: (95-117)/(70-74) 113/72 (05/07 0334) SpO2:  [97 %-100 %] 97 % (05/07 0334)  Hemodynamic parameters for last 24 hours:    Intake/Output from previous day: 05/06 0701 - 05/07 0700 In: 260 [P.O.:240] Out: 776 [Urine:750; Chest Tube:26] Intake/Output this shift: No intake/output data recorded.  General appearance: alert, cooperative and no distress Neurologic: intact Heart: regular rate and rhythm Lungs: clear to auscultation bilaterally no air leak  Lab Results: Recent Labs    10/31/19 1527 11/01/19 1345  WBC 7.2 13.7*  HGB 15.4 15.2  HCT 47.4 45.9  PLT 288 247   BMET:  Recent Labs    10/31/19 1527 11/01/19 1345  NA 141 135  K 4.1 4.3  CL 103 104  CO2 26 24  GLUCOSE 64* 112*  BUN 16 12  CREATININE 1.03 0.81  CALCIUM 9.6 8.8*    PT/INR:  Recent Labs    11/01/19 1345  LABPROT 13.0  INR 1.0   ABG No results found for: PHART, HCO3, TCO2, ACIDBASEDEF, O2SAT CBG (last 3)  No results for input(s): GLUCAP in the last 72 hours.  Assessment/Plan: S/P Procedure(s) (LRB): REDO VIDEO ASSISTED THORACOSCOPY (Right) STAPLING OF BLEBS, PLEURAL ABRASION (Right) -Recurrent right spontaneous pneumothorax CT shows multiple RUL blebs, minimal residual pneumothorax Also shows a small GGO in RML- will need f/u CT in 3-6 months  He is refusing surgery. I explained the extremely high risk of recurrent pneumothorax without VATS. He understands those risks.   Will place tube to water seal as there is no leak. If no leak tomorrow will remove tube and  dicharge.   If he decides to have surgery can schedule as an elective procedure next week   LOS: 2 days    Loreli Slot 11/02/2019

## 2019-11-02 NOTE — Discharge Instructions (Signed)
Pneumothorax A pneumothorax is commonly called a collapsed lung. It is a condition in which air leaks from a lung and builds up between the thin layer of tissue that covers the lungs (visceral pleura) and the interior wall of the chest cavity (parietal pleura). The air gets trapped outside the lung, between the lung and the chest wall (pleural space). The air takes up space and prevents the lung from fully expanding. This condition sometimes occurs suddenly with no apparent cause. The buildup of air may be small or large. A small pneumothorax may go away on its own. A large pneumothorax will require treatment and hospitalization. What are the causes? This condition may be caused by:  Trauma and injury to the chest wall.  Surgery and other medical procedures.  A complication of an underlying lung problem, especially chronic obstructive pulmonary disease (COPD) or emphysema. Sometimes the cause of this condition is not known. What increases the risk? You are more likely to develop this condition if:  You have an underlying lung problem.  You smoke.  You are 20-40 years old, male, tall, and underweight.  You have a personal or family history of pneumothorax.  You have an eating disorder (anorexia nervosa). This condition can also happen quickly, even in people with no history of lung problems. What are the signs or symptoms? Sometimes a pneumothorax will have no symptoms. When symptoms are present, they can include:  Chest pain.  Shortness of breath.  Increased rate of breathing.  Bluish color to your lips or skin (cyanosis). How is this diagnosed? This condition may be diagnosed by:  A medical history and physical exam.  A chest X-ray, chest CT scan, or ultrasound. How is this treated? Treatment depends on how severe your condition is. The goal of treatment is to remove the extra air and allow your lung to expand back to its normal size.  For a small pneumothorax: ? No  treatment may be needed. ? Extra oxygen is sometimes used to make it go away more quickly.  For a large pneumothorax or a pneumothorax that is causing symptoms, a procedure is done to drain the air from your lungs. To do this, a health care provider may use: ? A needle with a syringe. This is used to suck air from a pleural space where no additional leakage is taking place. ? A chest tube. This is used to suck air where there is ongoing leakage into the pleural space. The chest tube may need to remain in place for several days until the air leak has healed.  In more severe cases, surgery may be needed to repair the damage that is causing the leak.  If you have multiple pneumothorax episodes or have an air leak that will not heal, a procedure called a pleurodesis may be done. A medicine is placed in the pleural space to irritate the tissues around the lung so that the lung will stick to the chest wall, seal any leaks, and stop any buildup of air in that space. If you have an underlying lung problem, severe symptoms, or a large pneumothorax you will usually need to stay in the hospital. Follow these instructions at home: Lifestyle  Do not use any products that contain nicotine or tobacco, such as cigarettes and e-cigarettes. These are major risk factors in pneumothorax. If you need help quitting, ask your health care provider.  Do not lift anything that is heavier than 10 lb (4.5 kg), or the limit that your health care   provider tells you, until he or she says that it is safe.  Avoid activities that take a lot of effort (strenuous) for as long as told by your health care provider.  Return to your normal activities as told by your health care provider. Ask your health care provider what activities are safe for you.  Do not fly in an airplane or scuba dive until your health care provider says it is okay. General instructions  Take over-the-counter and prescription medicines only as told by your  health care provider.  If a cough or pain makes it difficult for you to sleep at night, try sleeping in a semi-upright position in a recliner or by using 2 or 3 pillows.  If you had a chest tube and it was removed, ask your health care provider when you can remove the bandage (dressing). While the dressing is in place, do not allow it to get wet.  Keep all follow-up visits as told by your health care provider. This is important. Contact a health care provider if:  You cough up thick mucus (sputum) that is yellow or green in color.  You were treated with a chest tube, and you have redness, increasing pain, or discharge at the site where it was placed. Get help right away if:  You have increasing chest pain or shortness of breath.  You have a cough that will not go away.  You begin coughing up blood.  You have pain that is getting worse or is not controlled with medicines.  The site where your chest tube was located opens up.  You feel air coming out of the site where the chest tube was placed.  You have a fever or persistent symptoms for more than 2-3 days.  You have a fever and your symptoms suddenly get worse. These symptoms may represent a serious problem that is an emergency. Do not wait to see if the symptoms will go away. Get medical help right away. Call your local emergency services (911 in the [place] Do not drive yourself to the hospital. Summary  A pneumothorax, commonly called a collapsed lung, is a condition in which air leaks from a lung and gets trapped between the lung and the chest wall (pleural space).  The buildup of air may be small or large. A small pneumothorax may go away on its own. A large pneumothorax will require treatment and hospitalization.  Treatment for this condition depends on how severe the pneumothorax is. The goal of treatment is to remove the extra air and allow the lung to expand back to its normal size. This information is not intended to  replace advice given to you by your health care provider. Make sure you discuss any questions you have with your health care provider. Document Revised: 05/27/2017 Document Reviewed: 05/23/2017 Elsevier Patient Education  2020 [employer]  

## 2019-11-03 ENCOUNTER — Inpatient Hospital Stay (HOSPITAL_COMMUNITY): Payer: BLUE CROSS/BLUE SHIELD

## 2019-11-03 NOTE — Progress Notes (Addendum)
1 Day Post-Op Procedure(s) (LRB): REDO VIDEO ASSISTED THORACOSCOPY (Right) STAPLING OF BLEBS, PLEURAL ABRASION (Right) Subjective: Tube became disconnected last night for an uncertain time period No new c/o  Objective: Vital signs in last 24 hours: Temp:  [98.2 F (36.8 C)-99 F (37.2 C)] 98.2 F (36.8 C) (05/08 0738) Pulse Rate:  [65-72] 65 (05/08 0307) Cardiac Rhythm: Normal sinus rhythm (05/08 0744) Resp:  [17-18] 17 (05/08 0307) BP: (82-121)/(68-72) 82/72 (05/08 0738) SpO2:  [96 %-98 %] 96 % (05/08 0738)  Hemodynamic parameters for last 24 hours:    Intake/Output from previous day: 05/07 0701 - 05/08 0700 In: 10  Out: 655 [Urine:625; Chest Tube:30] Intake/Output this shift: Total I/O In: -  Out: 200 [Urine:200]  General appearance: alert, cooperative and no distress Heart: regular rate and rhythm Lungs: clear to auscultation bilaterally  Lab Results: Recent Labs    10/31/19 1527 11/01/19 1345  WBC 7.2 13.7*  HGB 15.4 15.2  HCT 47.4 45.9  PLT 288 247   BMET:  Recent Labs    10/31/19 1527 11/01/19 1345  NA 141 135  K 4.1 4.3  CL 103 104  CO2 26 24  GLUCOSE 64* 112*  BUN 16 12  CREATININE 1.03 0.81  CALCIUM 9.6 8.8*    PT/INR:  Recent Labs    11/01/19 1345  LABPROT 13.0  INR 1.0   ABG No results found for: PHART, HCO3, TCO2, ACIDBASEDEF, O2SAT CBG (last 3)  No results for input(s): GLUCAP in the last 72 hours.  Meds Scheduled Meds: . sodium chloride flush  10 mL Intracatheter Q8H  . sodium chloride flush  3 mL Intravenous Q12H   Continuous Infusions: . sodium chloride    .  ceFAZolin (ANCEF) IV     PRN Meds:.sodium chloride, acetaminophen **OR** acetaminophen, fentaNYL (SUBLIMAZE) injection, ondansetron **OR** ondansetron (ZOFRAN) IV, oxyCODONE, polyethylene glycol, sodium chloride flush  Xrays CT CHEST WO CONTRAST  Result Date: 11/01/2019 CLINICAL DATA:  Cough, recurrent pneumothorax, evaluate for blebs EXAM: CT CHEST WITHOUT  CONTRAST TECHNIQUE: Multidetector CT imaging of the chest was performed following the standard protocol without IV contrast. COMPARISON:  08/10/2015 FINDINGS: Cardiovascular: No significant vascular findings. Normal heart size. No pericardial effusion. Mediastinum/Nodes: No enlarged mediastinal, hilar, or axillary lymph nodes. Thyroid gland, trachea, and esophagus demonstrate no significant findings. Lungs/Pleura: Right-sided pigtail chest tube is in position about the upper right lung without significant pneumothorax at this time. There are multiple biapical emphysematous blebs and evidence of a prior right apical wedge resection. Nonspecific 1.0 cm ground-glass opacity of the medial segment right middle lobe (series 4, image 106). Somewhat opacity at the right pulmonary apex measuring approximately 1.8 x 1.1 cm which is fatty attenuation, possibly adherent to the pleura (-50 HU) (series 2, image 28). Upper Abdomen: No acute abnormality. Musculoskeletal: No chest wall mass or suspicious bone lesions identified. IMPRESSION: 1. Right-sided pigtail chest tube is in position about the upper right lung without significant pneumothorax at this time. 2. Multiple biapical emphysematous blebs and evidence of prior right apical wedge resection. 3. Nonspecific 1.0 cm ground-glass opacity of the medial segment right middle lobe (series 4, image 106). Initial follow-up with CT at 6-12 months is recommended to confirm persistence. If persistent, repeat CT is recommended every 2 years until 5 years of stability has been established. This recommendation follows the consensus statement: Guidelines for Management of Incidental Pulmonary Nodules Detected on CT Images: From the Fleischner Society 2017; Radiology 2017; 284:228-243. 4. Emphysema (ICD10-J43.9). Electronically Signed  By: Eddie Candle M.D.   On: 11/01/2019 16:10   DG Chest Port 1 View  Result Date: 11/03/2019 CLINICAL DATA:  Recent pneumothorax the right. Previous  bleb stapling in the apical regions EXAM: PORTABLE CHEST 1 VIEW COMPARISON:  Nov 01, 2019 FINDINGS: Chest tube remains on the right, stable. Small right apical pneumothorax is stable. No tension component. Postoperative changes noted in the right apex region. There is no edema or airspace opacity. Heart size and pulmonary vascularity are normal. No adenopathy. No bone lesions. IMPRESSION: Chest tube present on the right with small apical pneumothorax on the right, stable. Postoperative change right apex. No edema or airspace opacity. Cardiac silhouette limits. Electronically Signed   By: Lowella Grip III M.D.   On: 11/03/2019 08:56    Assessment/Plan: S/P Procedure(s) (LRB): REDO VIDEO ASSISTED THORACOSCOPY (Right) STAPLING OF BLEBS, PLEURAL ABRASION (Right)  1 remains clinically pretty stable 2 afeb, VSS 3 sats good on RA 4 CXR, small, fairly stable appearance of pntx 5 tiny air leak , intermit with cough 6 Dr Servando Snare wants to leave tube for one more day, will repeat CXR in am  LOS: 3 days    Victor Giovanni PA-C Pager [phone] 11/03/2019 Ct found disconnected from patient early this am Small ptx on xray so chest tube reconnected and left for now, tube has fluctuation  but no air leak on water seal I have seen and examined Victor Jefferson and agree with the above assessment  and plan.  Grace Isaac MD Beeper [phone] Office [phone] 11/03/2019 11:20 AM

## 2019-11-04 ENCOUNTER — Inpatient Hospital Stay (HOSPITAL_COMMUNITY): Payer: BLUE CROSS/BLUE SHIELD

## 2019-11-04 NOTE — Progress Notes (Addendum)
      [address]       [place] [address]             [phone]      2 Days Post-Op Procedure(s) (LRB): REDO VIDEO ASSISTED THORACOSCOPY (Right) STAPLING OF BLEBS, PLEURAL ABRASION (Right) Subjective: Feels fine, not SOB  Objective: Vital signs in last 24 hours: Temp:  [98.2 F (36.8 C)-98.6 F (37 C)] 98.3 F (36.8 C) (05/09 0453) Pulse Rate:  [63-83] 63 (05/09 0453) Cardiac Rhythm: Normal sinus rhythm (05/09 0040) Resp:  [15-20] 18 (05/09 0453) BP: (82-115)/(63-78) 90/74 (05/09 0453) SpO2:  [96 %-100 %] 98 % (05/09 0453)  Hemodynamic parameters for last 24 hours:    Intake/Output from previous day: 05/08 0701 - 05/09 0700 In: 140 [P.O.:120; I.V.:10] Out: 525 [Urine:525] Intake/Output this shift: No intake/output data recorded.  General appearance: alert, cooperative and no distress Heart: regular rate and rhythm Lungs: clear to auscultation bilaterally  Lab Results: Recent Labs    11/01/19 1345  WBC 13.7*  HGB 15.2  HCT 45.9  PLT 247   BMET:  Recent Labs    11/01/19 1345  NA 135  K 4.3  CL 104  CO2 24  GLUCOSE 112*  BUN 12  CREATININE 0.81  CALCIUM 8.8*    PT/INR:  Recent Labs    11/01/19 1345  LABPROT 13.0  INR 1.0   ABG No results found for: PHART, HCO3, TCO2, ACIDBASEDEF, O2SAT CBG (last 3)  No results for input(s): GLUCAP in the last 72 hours.  Meds Scheduled Meds: . sodium chloride flush  10 mL Intracatheter Q8H  . sodium chloride flush  3 mL Intravenous Q12H   Continuous Infusions: . sodium chloride    .  ceFAZolin (ANCEF) IV     PRN Meds:.sodium chloride, acetaminophen **OR** acetaminophen, fentaNYL (SUBLIMAZE) injection, ondansetron **OR** ondansetron (ZOFRAN) IV, oxyCODONE, polyethylene glycol, sodium chloride flush  Xrays DG Chest Port 1 View  Result Date: 11/03/2019 CLINICAL DATA:  Recent pneumothorax the right. Previous bleb stapling in the apical regions EXAM: PORTABLE CHEST 1 VIEW COMPARISON:   Nov 01, 2019 FINDINGS: Chest tube remains on the right, stable. Small right apical pneumothorax is stable. No tension component. Postoperative changes noted in the right apex region. There is no edema or airspace opacity. Heart size and pulmonary vascularity are normal. No adenopathy. No bone lesions. IMPRESSION: Chest tube present on the right with small apical pneumothorax on the right, stable. Postoperative change right apex. No edema or airspace opacity. Cardiac silhouette limits. Electronically Signed   By: Bretta Bang III M.D.   On: 11/03/2019 08:56    Assessment/Plan: S/P Procedure(s) (LRB): REDO VIDEO ASSISTED THORACOSCOPY (Right) STAPLING OF BLEBS, PLEURAL ABRASION (Right)  1 stable with CT on H2Oseal, I do not see air leakk this am and CXR is fairly stable. Will d/c CT this am and if CXR in a few hours after removal is ok will d/c to home.  LOS: 4 days    Rowe Clack PA-C Pager [phone] 11/04/2019  Chest tube removed, if post chest xray ok home today  I have seen and examined Victor Jefferson and agree with the above assessment  and plan.  Delight Ovens MD Beeper [phone] Office [phone] 11/04/2019 11:28 AM

## 2019-11-04 NOTE — Progress Notes (Signed)
Chest tube remvd per order---no complications, skin is clean,dry,intact, xray advised to come for xray (already ordered) at 11:50

## 2019-11-12 ENCOUNTER — Other Ambulatory Visit: Payer: Self-pay | Admitting: Thoracic Surgery (Cardiothoracic Vascular Surgery)

## 2019-11-12 DIAGNOSIS — J939 Pneumothorax, unspecified: Secondary | ICD-10-CM

## 2019-11-13 ENCOUNTER — Other Ambulatory Visit: Payer: Self-pay

## 2019-11-13 ENCOUNTER — Ambulatory Visit: Payer: BLUE CROSS/BLUE SHIELD | Admitting: Thoracic Surgery (Cardiothoracic Vascular Surgery)

## 2019-11-13 ENCOUNTER — Ambulatory Visit
Admission: RE | Admit: 2019-11-13 | Discharge: 2019-11-13 | Disposition: A | Discharge status: Home / Self Care | Payer: BLUE CROSS/BLUE SHIELD | Source: Ambulatory Visit | Attending: Thoracic Surgery (Cardiothoracic Vascular Surgery) | Admitting: Thoracic Surgery (Cardiothoracic Vascular Surgery)

## 2019-11-13 VITALS — BP 111/72 | HR 68 | Temp 97.7°F | Resp 20 | Ht 71.0 in | Wt 129.0 lb

## 2019-11-13 DIAGNOSIS — J939 Pneumothorax, unspecified: Secondary | ICD-10-CM

## 2019-11-13 NOTE — Progress Notes (Signed)
[address]       [name] [address]             [phone]     HPI: Victor Jefferson returns for scheduled follow-up visit  Victor Jefferson is a 44 year old man with a history of tobacco abuse and recurrent right spontaneous pneumothoraces.  He had a right VATS with blebectomy and pleural abrasion in 2017 after having a recurrent pneumothorax.  He did well until 10/31/2019 when he presented with 4-day history of some shortness of breath and chest discomfort.  He was found to have a recurrent right pneumothorax.  He had a chest tube placed and redo surgery was recommended but he declined an operation.  His air leak resolved his tube was removed and he went home on 11/04/2019.  He had continued to smoke prior to admission.  He says that he has not smoked since he left the hospital.  He has not had any recurrent chest pain or shortness of breath since leaving the hospital.  Past Medical History:  Diagnosis Date  . Eczema   . Spontaneous pneumothorax     Current Outpatient Medications  Medication Sig Dispense Refill  . acetaminophen (TYLENOL) 325 MG tablet Take 650 mg by mouth every 6 (six) hours as needed.     No current facility-administered medications for this visit.    Physical Exam BP 111/72   Pulse 68   Temp 97.7 F (36.5 C) (Skin)   Resp 20   Ht 5\' 11"  (1.803 m)   Wt 129 lb (58.5 kg)   SpO2 97% Comment: RA  BMI 17.20 kg/m  44 year old man in no acute distress Thin Alert and oriented x3 with no focal deficits Lungs clear with equal breath sounds bilaterally Cardiac regular rate and rhythm Chest tube site healing well  Diagnostic Tests: CHEST - 2 VIEW  COMPARISON:  Chest x-ray 11/04/2019.  Chest CT 11/01/2019  FINDINGS: Apical blebs and scarring bilaterally. Surgical clips in the right apex.  No definite pneumothorax in the right apex.  Lungs are well aerated and clear. No infiltrate or effusion. Heart size and vascularity  normal.  IMPRESSION: Apical blebs bilaterally. Postsurgical blebectomy on the right. No definite pneumothorax.   Electronically Signed   By: 01/01/2020 M.D.   On: 11/13/2019 11:06 I personally reviewed the chest x-ray images and concur with the findings noted above  Impression: Victor Jefferson is a 44 year old man with a history of recurrent right spontaneous pneumothoraces and tobacco abuse.  He had a right VATS for blebectomy and pleural abrasion in 2017 after his second pneumothorax.  He then presented back with a recurrent pneumothorax on 10/31/2019.  That was managed with a chest tube.  He was advised to undergo redo surgery but refused.  A CT that was done while in the hospital showed new blebs in the right lung.  I discussed with him that he is at high risk for recurrence.  He understands that.  I emphasized the importance of complete tobacco cessation, including not vaping or any other inhalational substances.  He is anxious to return to work.  He works as a 12/31/2019.  I told him he can go back to work on 11/27/2019 but should not lift anything over 20 pounds for the first week.  Plan: I will be happy to see Victor Jefferson back anytime if I can be of any further assistance  01/27/2020, MD [facility] [phone]

## 2019-12-15 ENCOUNTER — Emergency Department (HOSPITAL_COMMUNITY): Payer: BLUE CROSS/BLUE SHIELD

## 2019-12-15 ENCOUNTER — Emergency Department (HOSPITAL_COMMUNITY)
Admission: EM | Admit: 2019-12-15 | Discharge: 2019-12-16 | Disposition: A | Discharge status: Home / Self Care | Payer: BLUE CROSS/BLUE SHIELD | Attending: Emergency Medicine | Admitting: Emergency Medicine

## 2019-12-15 ENCOUNTER — Encounter (HOSPITAL_COMMUNITY): Payer: Self-pay

## 2019-12-15 DIAGNOSIS — R079 Chest pain, unspecified: Secondary | ICD-10-CM | POA: Insufficient documentation

## 2019-12-15 DIAGNOSIS — Z5321 Procedure and treatment not carried out due to patient leaving prior to being seen by health care provider: Secondary | ICD-10-CM | POA: Diagnosis not present

## 2019-12-15 MED ORDER — SODIUM CHLORIDE 0.9% FLUSH: 3.0000 mL | Freq: Once | INTRAVENOUS | Status: DC

## 2019-12-15 NOTE — ED Triage Notes (Signed)
Onset today pt did not feel well.  C/o left upper back "muscle cramp".  Pt reports it feels just like his right pneumothorax pain he had in May.  No shortness of breath.  Talking in complete sentences.

## 2019-12-16 LAB — BASIC METABOLIC PANEL
Anion gap: 11 (ref 5–15)
BUN: 9 mg/dL (ref 6–20)
CO2: 26 mmol/L (ref 22–32)
Calcium: 9 mg/dL (ref 8.9–10.3)
Chloride: 100 mmol/L (ref 98–111)
Creatinine, Ser: 1.01 mg/dL (ref 0.61–1.24)
GFR calc Af Amer: >60 mL/min (ref >60)
GFR calc non Af Amer: >60 mL/min (ref >60)
Glucose, Bld: 94 mg/dL (ref 70–99)
Potassium: 4 mmol/L (ref 3.5–5.1)
Sodium: 137 mmol/L (ref 135–145)

## 2019-12-16 LAB — CBC
HCT: 43.2 % (ref 39.0–52.0)
Hemoglobin: 14.5 g/dL (ref 13.0–17.0)
MCH: 31.7 pg (ref 26.0–34.0)
MCHC: 33.6 g/dL (ref 30.0–36.0)
MCV: 94.5 fL (ref 80.0–100.0)
Platelets: 251 10*3/uL (ref 150–400)
RBC: 4.57 MIL/uL (ref 4.22–5.81)
RDW: 13.4 % (ref 11.5–15.5)
WBC: 5.6 10*3/uL (ref 4.0–10.5)
nRBC: 0 % (ref 0.0–0.2)

## 2019-12-16 LAB — TROPONIN I (HIGH SENSITIVITY)
Troponin I (High Sensitivity): 3 ng/L (ref <18)
Troponin I (High Sensitivity): <2 ng/L (ref <18)

## 2019-12-16 NOTE — ED Notes (Signed)
Pt said he can no longer wait encouraged pt to stay

## 2019-12-17 ENCOUNTER — Telehealth: Payer: Self-pay

## 2019-12-17 ENCOUNTER — Encounter (HOSPITAL_COMMUNITY): Payer: Self-pay

## 2019-12-17 ENCOUNTER — Other Ambulatory Visit: Payer: Self-pay

## 2019-12-17 ENCOUNTER — Ambulatory Visit (HOSPITAL_COMMUNITY)
Admission: EM | Admit: 2019-12-17 | Discharge: 2019-12-17 | Disposition: A | Discharge status: Home / Self Care | Payer: BLUE CROSS/BLUE SHIELD | Attending: Family Medicine | Admitting: Family Medicine

## 2019-12-17 DIAGNOSIS — K2900 Acute gastritis without bleeding: Secondary | ICD-10-CM | POA: Diagnosis not present

## 2019-12-17 DIAGNOSIS — R112 Nausea with vomiting, unspecified: Secondary | ICD-10-CM | POA: Diagnosis not present

## 2019-12-17 DIAGNOSIS — R1084 Generalized abdominal pain: Secondary | ICD-10-CM

## 2019-12-17 DIAGNOSIS — R0789 Other chest pain: Secondary | ICD-10-CM

## 2019-12-17 MED ORDER — ONDANSETRON 4 MG PO TBDP
ORAL_TABLET | ORAL | Status: AC
  Filled 2019-12-17: qty 1

## 2019-12-17 MED ORDER — ONDANSETRON 4 MG PO TBDP
4.0000 mg | ORAL_TABLET | Freq: Once | ORAL | Status: AC
  Administered 2019-12-17: 4 mg via ORAL

## 2019-12-17 MED ORDER — ONDANSETRON HCL 4 MG PO TABS
4.0000 mg | ORAL_TABLET | Freq: Four times a day (QID) | ORAL | 0 refills | Status: DC
Start: 2019-12-17 — End: 2020-12-11

## 2019-12-17 NOTE — ED Triage Notes (Signed)
Pt presents with RUQ & LUQ abdominal pain, nausea, and generalized body aches X 3 days.  Pt went to ED 2 days ago but left AMA.  Pt was seen in UC for pneumothorax 30 days ago and believes this may be a reoccurence.

## 2019-12-17 NOTE — ED Provider Notes (Signed)
[place]   [id number] 12/17/19 Arrival Time: 27  CC: ABDOMINAL PAIN  SUBJECTIVE:  Victor Jefferson is a 44 y.o. male who presents with complaint of abdominal discomfort that began gradually about 3 days ago. Reports nausea and body aches. Denies a precipitating event, trauma, close contacts with similar symptoms, recent travel or antibiotic use.  Localizes pain to generalized abdomen. Describes as persistent and achy in character. Has not taken OTC medications for this. Denies alleviating or aggravating factors. Reports that he has had a spontaneous pneumothorax before and that he is worried that he may be having another. He went to the ER for this 2 days ago but left before he was seen.  Denies fever, chills, appetite changes, weight changes, chest pain, SOB, diarrhea, constipation, hematochezia, melena, dysuria, difficulty urinating, increased frequency or urgency, flank pain, loss of bowel or bladder function, ROS: As per HPI.  All other pertinent ROS negative.     Past Medical History:  Diagnosis Date  . Eczema   . Spontaneous pneumothorax    Past Surgical History:  Procedure Laterality Date  . MASS EXCISION Right 09/13/2016   Procedure: EXCISION OF RIGHT CHEEK SEBACEOUS CYST;  Surgeon: Loel Lofty Dillingham, DO;  Location: [place];  Service: Plastics;  Laterality: Right;  . STAPLING OF BLEBS Right 08/13/2015   Procedure: BLEBECTOMY;  Surgeon: Melrose Nakayama, MD;  Location: [place];  Service: Thoracic;  Laterality: Right;  [name] VIDEO ASSISTED THORACOSCOPY Right 08/13/2015   Procedure: VIDEO ASSISTED THORACOSCOPY, plerual abrasion;  Surgeon: Melrose Nakayama, MD;  Location: [place];  Service: Thoracic;  Laterality: Right;   Allergies  Allergen Reactions  . Latex Other (See Comments)    Burning ,itchy   . 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    No current facility-administered medications on file prior to encounter.   Current Outpatient  Medications on File Prior to Encounter  Medication Sig Dispense Refill  . acetaminophen (TYLENOL) 325 MG tablet Take 650 mg by mouth every 6 (six) hours as needed.     Social History   Socioeconomic History  . Marital status: Single    Spouse name: Not on file  . Number of children: Not on file  . Years of education: Not on file  . Highest education level: Not on file  Occupational History  . Not on file  Tobacco Use  . Smoking status: Current Every Day Smoker    Years: 18.00    Types: E-cigarettes  . Smokeless tobacco: Never Used  Substance and Sexual Activity  . Alcohol use: Yes    Alcohol/week: 6.0 standard drinks    Types: 6 Cans of beer per week  . Drug use: No  . Sexual activity: Not on file  Other Topics Concern  . Not on file  Social History Narrative  . Not on file   Social Determinants of Health   Financial Resource Strain:   . Difficulty of Paying Living Expenses:   Food Insecurity:   . Worried About Charity fundraiser in the Last Year:   . Arboriculturist in the Last Year:   Transportation Needs:   . Film/video editor (Medical):   [name] Lack of Transportation (Non-Medical):   Physical Activity:   . Days of Exercise per Week:   . Minutes of Exercise per Session:   Stress:   . Feeling of Stress :   Social Connections:   . Frequency of Communication with Friends  and Family:   . Frequency of Social Gatherings with Friends and Family:   . Attends Religious Services:   . Active Member of Clubs or Organizations:   . Attends Banker Meetings:   [name] Marital Status:   Intimate Partner Violence:   . Fear of Current or Ex-Partner:   . Emotionally Abused:   [name] Physically Abused:   . Sexually Abused:    Family History  Problem Relation Age of Onset  . Healthy Mother   . Cancer Father      OBJECTIVE:  Vitals:   12/17/19 1223  BP: (!) 108/59  Pulse: 73  Resp: 17  Temp: 98.6 F (37 C)  TempSrc: Oral  SpO2: 98%    General appearance:  Alert; NAD HEENT: NCAT.  Oropharynx clear.  Lungs: clear to auscultation bilaterally without adventitious breath sounds Heart: regular rate and rhythm.  Radial pulses 2+ symmetrical bilaterally Abdomen: soft, non-distended; normal active bowel sounds; non-tender to light and deep palpation; nontender at McBurney's point; negative Murphy's sign; negative rebound; no guarding Back: no CVA tenderness Extremities: no edema; symmetrical with no gross deformities Skin: warm and dry Neurologic: normal gait Psychological: alert and cooperative; normal mood and affect  LABS: No results found for this or any previous visit (from the past 24 hour(s)).  DIAGNOSTIC STUDIES: No results found.   ASSESSMENT & PLAN:  1. Nausea and vomiting, intractability of vomiting not specified, unspecified vomiting type   2. Acute gastritis without hemorrhage, unspecified gastritis type   3. Other chest pain   4. Generalized abdominal pain     Meds ordered this encounter  Medications  . ondansetron (ZOFRAN-ODT) disintegrating tablet 4 mg  . ondansetron (ZOFRAN) 4 MG tablet    Sig: Take 1 tablet (4 mg total) by mouth every 6 (six) hours.    Dispense:  12 tablet    Refill:  0    Order Specific Question:   Supervising Provider    Answer:   Merrilee Jansky [[id number]]    Abd Pain Nausea and Vomiting Acute Gastritis Chest Pain  CXR negative for new pneumothorax, previously repaired pneumo evident on xray Get rest and drink fluids Zofran in office today Zofran prescribed.   Take as directed.    DIET Instructions:  30 minutes after taking nausea medicine, begin with sips of clear liquids. If able to hold down 2 - 4 ounces for 30 minutes, begin drinking more. Increase your fluid intake to replace losses. Clear liquids only for 24 hours (water, tea, sport drinks, clear flat ginger ale or cola and juices, broth, jello, popsicles, ect). Advance to bland foods, applesauce, rice, baked or boiled chicken,  ect. Avoid milk, greasy foods and anything that doesn't agree with you.  If you experience new or worsening symptoms return or go to ER such as fever, chills, nausea, vomiting, diarrhea, bloody or dark tarry stools, constipation, urinary symptoms, worsening abdominal discomfort, symptoms that do not improve with medications, inability to keep fluids down.  Reviewed expectations re: course of current medical issues. Questions answered. Outlined signs and symptoms indicating need for more acute intervention. Patient verbalized understanding. After Visit Summary given.    Moshe Cipro, NP 12/22/19 2108

## 2019-12-17 NOTE — Discharge Instructions (Addendum)
You are experiencing gastritis.   You have received Zofran in the office today, I have also sent some Zofran to your pharmacy  If this is not helping and you cannot keep liquids down, go to the ER  Follow up as needed

## 2019-12-17 NOTE — Telephone Encounter (Signed)
-----   Message from Loreli Slot, MD sent at 12/17/2019  2:19 PM EDT ----- Regarding: RE: please review CXR CXR was fine, no pneumothorax  SCH ----- Message ----- From: Joycelyn Schmid, LPN Sent: 03/17/1659   9:43 AM EDT To: Loreli Slot, MD Subject: please review CXR                              Mr. Obey went to ED over the weekend due to upper back pain. NO Shortness of breath/ He left the ED after a 10 hour wait.  He did get a CXR while he was there. Can you please review and advise.  I did speak with his mom this Am. And she is wanting him to come in and see you if needed. I left a voicemail for Mr Brookens to call me back with an update on how he is feeling today.

## 2019-12-18 ENCOUNTER — Encounter (HOSPITAL_COMMUNITY): Payer: Self-pay | Admitting: Emergency Medicine

## 2019-12-18 ENCOUNTER — Emergency Department (HOSPITAL_COMMUNITY)
Admission: EM | Admit: 2019-12-18 | Discharge: 2019-12-19 | Disposition: A | Discharge status: Home / Self Care | Payer: BLUE CROSS/BLUE SHIELD | Attending: Emergency Medicine | Admitting: Emergency Medicine

## 2019-12-18 ENCOUNTER — Emergency Department (HOSPITAL_COMMUNITY): Payer: BLUE CROSS/BLUE SHIELD

## 2019-12-18 DIAGNOSIS — R0789 Other chest pain: Secondary | ICD-10-CM | POA: Diagnosis present

## 2019-12-18 DIAGNOSIS — Z5321 Procedure and treatment not carried out due to patient leaving prior to being seen by health care provider: Secondary | ICD-10-CM | POA: Insufficient documentation

## 2019-12-18 LAB — CBC
HCT: 48.7 % (ref 39.0–52.0)
Hemoglobin: 16.1 g/dL (ref 13.0–17.0)
MCH: 31.1 pg (ref 26.0–34.0)
MCHC: 33.1 g/dL (ref 30.0–36.0)
MCV: 94.2 fL (ref 80.0–100.0)
Platelets: 204 10*3/uL (ref 150–400)
RBC: 5.17 MIL/uL (ref 4.22–5.81)
RDW: 13.5 % (ref 11.5–15.5)
WBC: 8.2 10*3/uL (ref 4.0–10.5)
nRBC: 0 % (ref 0.0–0.2)

## 2019-12-18 LAB — BASIC METABOLIC PANEL
Anion gap: 16 — ABNORMAL HIGH (ref 5–15)
BUN: 13 mg/dL (ref 6–20)
CO2: 24 mmol/L (ref 22–32)
Calcium: 9 mg/dL (ref 8.9–10.3)
Chloride: 97 mmol/L — ABNORMAL LOW (ref 98–111)
Creatinine, Ser: 0.8 mg/dL (ref 0.61–1.24)
GFR calc Af Amer: >60 mL/min (ref >60)
GFR calc non Af Amer: >60 mL/min (ref >60)
Glucose, Bld: 79 mg/dL (ref 70–99)
Potassium: 4.4 mmol/L (ref 3.5–5.1)
Sodium: 137 mmol/L (ref 135–145)

## 2019-12-18 LAB — TROPONIN I (HIGH SENSITIVITY): Troponin I (High Sensitivity): <2 ng/L (ref <18)

## 2019-12-18 MED ORDER — SODIUM CHLORIDE 0.9% FLUSH: 3.0000 mL | Freq: Once | INTRAVENOUS | Status: DC

## 2019-12-18 NOTE — ED Triage Notes (Signed)
Patient here from home reporting left sided chest pain that started Friday. States that he has a hx of pneumothorax on right side. Denies n/v.

## 2020-01-01 ENCOUNTER — Ambulatory Visit: Payer: BLUE CROSS/BLUE SHIELD | Admitting: Thoracic Surgery (Cardiothoracic Vascular Surgery)

## 2020-12-01 ENCOUNTER — Inpatient Hospital Stay (HOSPITAL_COMMUNITY)
Admission: EM | Admit: 2020-12-01 | Discharge: 2020-12-11 | DRG: 164 | Disposition: A | Discharge status: Home / Self Care | Payer: BLUE CROSS/BLUE SHIELD | Attending: Thoracic Surgery (Cardiothoracic Vascular Surgery) | Admitting: Thoracic Surgery (Cardiothoracic Vascular Surgery)

## 2020-12-01 ENCOUNTER — Other Ambulatory Visit: Payer: Self-pay

## 2020-12-01 ENCOUNTER — Encounter (HOSPITAL_COMMUNITY): Payer: Self-pay | Admitting: Emergency Medicine

## 2020-12-01 ENCOUNTER — Emergency Department (HOSPITAL_COMMUNITY): Payer: BLUE CROSS/BLUE SHIELD

## 2020-12-01 ENCOUNTER — Ambulatory Visit (INDEPENDENT_AMBULATORY_CARE_PROVIDER_SITE_OTHER): Payer: BLUE CROSS/BLUE SHIELD

## 2020-12-01 ENCOUNTER — Encounter (HOSPITAL_COMMUNITY): Payer: Self-pay

## 2020-12-01 ENCOUNTER — Ambulatory Visit (HOSPITAL_COMMUNITY)
Admission: EM | Admit: 2020-12-01 | Discharge: 2020-12-01 | Payer: BLUE CROSS/BLUE SHIELD | Attending: Family Medicine | Admitting: Family Medicine

## 2020-12-01 ENCOUNTER — Inpatient Hospital Stay (HOSPITAL_COMMUNITY): Payer: BLUE CROSS/BLUE SHIELD

## 2020-12-01 DIAGNOSIS — D62 Acute posthemorrhagic anemia: Secondary | ICD-10-CM | POA: Diagnosis not present

## 2020-12-01 DIAGNOSIS — J90 Pleural effusion, not elsewhere classified: Secondary | ICD-10-CM

## 2020-12-01 DIAGNOSIS — Z91040 Latex allergy status: Secondary | ICD-10-CM

## 2020-12-01 DIAGNOSIS — F17290 Nicotine dependence, other tobacco product, uncomplicated: Secondary | ICD-10-CM | POA: Diagnosis present

## 2020-12-01 DIAGNOSIS — Z79899 Other long term (current) drug therapy: Secondary | ICD-10-CM | POA: Diagnosis not present

## 2020-12-01 DIAGNOSIS — R42 Dizziness and giddiness: Secondary | ICD-10-CM

## 2020-12-01 DIAGNOSIS — Z91018 Allergy to other foods: Secondary | ICD-10-CM

## 2020-12-01 DIAGNOSIS — Z20822 Contact with and (suspected) exposure to covid-19: Secondary | ICD-10-CM | POA: Diagnosis present

## 2020-12-01 DIAGNOSIS — R079 Chest pain, unspecified: Secondary | ICD-10-CM | POA: Diagnosis not present

## 2020-12-01 DIAGNOSIS — Z9689 Presence of other specified functional implants: Secondary | ICD-10-CM

## 2020-12-01 DIAGNOSIS — R0602 Shortness of breath: Secondary | ICD-10-CM | POA: Diagnosis present

## 2020-12-01 DIAGNOSIS — J939 Pneumothorax, unspecified: Secondary | ICD-10-CM

## 2020-12-01 DIAGNOSIS — J9811 Atelectasis: Secondary | ICD-10-CM | POA: Diagnosis not present

## 2020-12-01 DIAGNOSIS — Z8619 Personal history of other infectious and parasitic diseases: Secondary | ICD-10-CM | POA: Diagnosis not present

## 2020-12-01 DIAGNOSIS — Z9889 Other specified postprocedural states: Secondary | ICD-10-CM

## 2020-12-01 DIAGNOSIS — J95812 Postprocedural air leak: Secondary | ICD-10-CM | POA: Diagnosis not present

## 2020-12-01 DIAGNOSIS — J9383 Other pneumothorax: Secondary | ICD-10-CM | POA: Diagnosis present

## 2020-12-01 DIAGNOSIS — Z09 Encounter for follow-up examination after completed treatment for conditions other than malignant neoplasm: Secondary | ICD-10-CM

## 2020-12-01 DIAGNOSIS — J984 Other disorders of lung: Secondary | ICD-10-CM | POA: Diagnosis present

## 2020-12-01 LAB — CBC WITH DIFFERENTIAL/PLATELET
Abs Immature Granulocytes: 0.03 10*3/uL (ref 0.00–0.07)
Basophils Absolute: 0 10*3/uL (ref 0.0–0.1)
Basophils Relative: 0 %
Eosinophils Absolute: 0.1 10*3/uL (ref 0.0–0.5)
Eosinophils Relative: 2 %
HCT: 48.8 % (ref 39.0–52.0)
Hemoglobin: 16.2 g/dL (ref 13.0–17.0)
Immature Granulocytes: 0 %
Lymphocytes Relative: 27 %
Lymphs Abs: 1.9 10*3/uL (ref 0.7–4.0)
MCH: 30.7 pg (ref 26.0–34.0)
MCHC: 33.2 g/dL (ref 30.0–36.0)
MCV: 92.4 fL (ref 80.0–100.0)
Monocytes Absolute: 0.9 10*3/uL (ref 0.1–1.0)
Monocytes Relative: 12 %
Neutro Abs: 4.1 10*3/uL (ref 1.7–7.7)
Neutrophils Relative %: 59 %
Platelets: 383 10*3/uL (ref 150–400)
RBC: 5.28 MIL/uL (ref 4.22–5.81)
RDW: 13.2 % (ref 11.5–15.5)
WBC: 7.1 10*3/uL (ref 4.0–10.5)
nRBC: 0 % (ref 0.0–0.2)

## 2020-12-01 LAB — BASIC METABOLIC PANEL
Anion gap: 8 (ref 5–15)
BUN: 12 mg/dL (ref 6–20)
CO2: 30 mmol/L (ref 22–32)
Calcium: 9.1 mg/dL (ref 8.9–10.3)
Chloride: 100 mmol/L (ref 98–111)
Creatinine, Ser: 1.05 mg/dL (ref 0.61–1.24)
GFR, Estimated: >60 mL/min (ref >60)
Glucose, Bld: 81 mg/dL (ref 70–99)
Potassium: 4.2 mmol/L (ref 3.5–5.1)
Sodium: 138 mmol/L (ref 135–145)

## 2020-12-01 LAB — RESP PANEL BY RT-PCR (FLU A&B, COVID) ARPGX2
Influenza A by PCR: NEGATIVE
Influenza B by PCR: NEGATIVE
SARS Coronavirus 2 by RT PCR: NEGATIVE

## 2020-12-01 LAB — PROTIME-INR
INR: 1 (ref 0.8–1.2)
Prothrombin Time: 12.7 seconds (ref 11.4–15.2)

## 2020-12-01 MED ORDER — LIDOCAINE-EPINEPHRINE (PF) 2 %-1:200000 IJ SOLN
10.0000 mL | Freq: Once | INTRAMUSCULAR | Status: AC
  Administered 2020-12-01: 10 mL
  Filled 2020-12-01: qty 20

## 2020-12-01 MED ORDER — MORPHINE SULFATE (PF) 2 MG/ML IV SOLN
2.0000 mg | INTRAVENOUS | Status: DC | PRN
Start: 2020-12-01 — End: 2020-12-03
  Administered 2020-12-02 (×2): 2 mg via INTRAVENOUS
  Filled 2020-12-01 (×2): qty 1

## 2020-12-01 MED ORDER — KETOROLAC TROMETHAMINE 15 MG/ML IJ SOLN
15.0000 mg | Freq: Four times a day (QID) | INTRAMUSCULAR | Status: DC | PRN
  Administered 2020-12-02: 15 mg via INTRAVENOUS
  Filled 2020-12-01: qty 1

## 2020-12-01 MED ORDER — ACETAMINOPHEN 500 MG PO TABS
1000.0000 mg | ORAL_TABLET | Freq: Four times a day (QID) | ORAL | Status: DC
  Administered 2020-12-01 – 2020-12-03 (×5): 1000 mg via ORAL
  Filled 2020-12-01 (×5): qty 2

## 2020-12-01 MED ORDER — MORPHINE SULFATE (PF) 4 MG/ML IV SOLN
4.0000 mg | Freq: Once | INTRAVENOUS | Status: AC
  Administered 2020-12-01: 4 mg via INTRAVENOUS
  Filled 2020-12-01: qty 1

## 2020-12-01 MED ORDER — ONDANSETRON HCL 4 MG PO TABS
4.0000 mg | ORAL_TABLET | Freq: Four times a day (QID) | ORAL | Status: DC
  Administered 2020-12-01 – 2020-12-03 (×5): 4 mg via ORAL
  Filled 2020-12-01 (×5): qty 1

## 2020-12-01 MED ORDER — ONDANSETRON HCL 4 MG PO TABS: 4.0000 mg | ORAL_TABLET | Freq: Four times a day (QID) | ORAL | Status: DC

## 2020-12-01 MED ORDER — ENOXAPARIN SODIUM 40 MG/0.4ML IJ SOSY
40.0000 mg | PREFILLED_SYRINGE | INTRAMUSCULAR | Status: DC
  Administered 2020-12-01 – 2020-12-02 (×2): 40 mg via SUBCUTANEOUS
  Filled 2020-12-01 (×2): qty 0.4

## 2020-12-01 MED ORDER — OXYCODONE HCL 5 MG PO TABS
5.0000 mg | ORAL_TABLET | ORAL | Status: DC | PRN
Start: 2020-12-01 — End: 2020-12-03

## 2020-12-01 NOTE — ED Triage Notes (Signed)
Pt is present today with chest pain (right side ), SOB, lightheadedness, and dizziness. Pt states that his sx started yesterday and he has a hx of pneumothorax

## 2020-12-01 NOTE — ED Notes (Signed)
Patient is being discharged from the Urgent Care and sent to the Emergency Department via EMS. Per Provider Joaquin Courts, patient is in need of higher level of care due to Pneumothorax. Patient is aware and verbalizes understanding of plan of care.   Vitals:   12/01/20 1847 12/01/20 1849  BP: (!) 131/99   Pulse: 98   Resp: 19   Temp:  (!) 97.5 F (36.4 C)  SpO2: 95%

## 2020-12-01 NOTE — ED Provider Notes (Signed)
[facility]    CSN: [id number] Arrival date & time: 12/01/20  1841      History   Chief Complaint Chief Complaint  Patient presents with  . Shortness of Breath  . Dizziness  . Chest Pain    Right side     HPI Victor Jefferson is a 45 y.o. male.   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  On arrival today patient is mildly tachycardic but continues to claim of intermittent chest pain.  He apparently had a pneumo in May 2021 and surgery was recommended but patient declined.  Past Medical History:  Diagnosis Date  . Eczema   . Spontaneous pneumothorax     Patient Active Problem List   Diagnosis Date Noted  . Recurrent spontaneous pneumothorax 10/31/2019  . S/P lung surgery, follow-up exam 08/13/2015  . Pneumothorax on right 08/10/2015    Past Surgical History:  Procedure Laterality Date  . MASS EXCISION Right 09/13/2016   Procedure: EXCISION OF RIGHT CHEEK SEBACEOUS CYST;  Surgeon: Alena Bills Dillingham, DO;  Location: [facility];  Service: Plastics;  Laterality: Right;  . STAPLING OF BLEBS Right 08/13/2015   Procedure: BLEBECTOMY;  Surgeon: Loreli Slot, MD;  Location: [facility] OR;  Service: Thoracic;  Laterality: Right;  [name] VIDEO ASSISTED THORACOSCOPY Right 08/13/2015   Procedure: VIDEO ASSISTED THORACOSCOPY, plerual abrasion;  Surgeon: Loreli Slot, MD;  Location: [facility] OR;  Service: Thoracic;  Laterality: Right;       Home Medications    Prior to Admission medications   Medication Sig Start Date End Date Taking? Authorizing Provider  acetaminophen (TYLENOL) 325 MG tablet Take 650 mg by mouth every 6 (six) hours as needed.    [provider]  ondansetron (ZOFRAN) 4 MG tablet Take 1 tablet (4 mg total) by mouth every 6 (six) hours. 12/17/19   Moshe Cipro, NP    Family History Family History  Problem  Relation Age of Onset  . Healthy Mother   . Cancer Father     Social History Social History   Tobacco Use  . Smoking status: Current Every Day Smoker    Years: 18.00    Types: E-cigarettes  . Smokeless tobacco: Never Used  Substance Use Topics  . Alcohol use: Yes    Alcohol/week: 6.0 standard drinks    Types: 6 Cans of beer per week  . Drug use: No     Allergies   Latex and Charentais melon (french melon)   Review of Systems Review of Systems Pertinent negatives listed in HPI   Physical Exam Triage Vital Signs ED Triage Vitals  Enc Vitals Group     BP 12/01/20 1847 (!) 131/99     Pulse Rate 12/01/20 1847 98     Resp 12/01/20 1847 19     Temp 12/01/20 1849 (!) 97.5 F (36.4 C)     Temp Source 12/01/20 1849 Temporal     SpO2 12/01/20 1847 95 %     Weight --      Height --      Head Circumference --      Peak Flow --      Pain Score 12/01/20 1847 5     Pain Loc --      Pain Edu? --      Excl. in GC? --    No data found.  Updated  Vital Signs BP (!) 131/99   Pulse 98   Temp (!) 97.5 F (36.4 C) (Temporal)   Resp 19   SpO2 95%   Visual Acuity Right Eye Distance:   Left Eye Distance:   Bilateral Distance:    Right Eye Near:   Left Eye Near:    Bilateral Near:     Physical Exam HENT:     Head: Normocephalic.  Cardiovascular:     Rate and Rhythm: Tachycardia present. Rhythm irregular.  Pulmonary:     Effort: Tachypnea and accessory muscle usage present.     Breath sounds: Examination of the right-upper field reveals decreased breath sounds. Examination of the right-middle field reveals decreased breath sounds. Examination of the right-lower field reveals decreased breath sounds. Decreased breath sounds present.  Skin:    Capillary Refill: Capillary refill takes less than 2 seconds.  Neurological:     Mental Status: He is alert and oriented to person, place, and time.  Psychiatric:        Mood and Affect: Mood normal.        Behavior: Behavior  normal.      UC Treatments / Results  Labs (all labs ordered are listed, but only abnormal results are displayed) Labs Reviewed - No data to display  EKG   Radiology No results found.  Procedures Procedures (including critical care time)  Medications Ordered in UC Medications - No data to display  Initial Impression / Assessment and Plan / UC Course  I have reviewed the triage vital signs and the nursing notes.  Pertinent labs & imaging results that were available during my care of the patient were reviewed by me and considered in my medical decision making (see chart for details).   EMS has been called as reviewed plain film images and it appears the patient has a subsequent recurrent right pneumothorax.  Patient was feeling dizzy and continues to be mildly tachycardic 2 L of O2 was placed on patient nasal cannula.Patient will be transferred to [facility], ER for further work-up and evaluation of abnormal findings on chest x-ray. Final Clinical Impressions(s) / UC Diagnoses   Final diagnoses:  None   Discharge Instructions   None    ED Prescriptions    None     PDMP not reviewed this encounter.   Bing Neighbors, FNP 12/01/20 [phone]

## 2020-12-01 NOTE — ED Provider Notes (Signed)
[facility] EMERGENCY DEPARTMENT Provider Note   CSN: [id number] Arrival date & time: 12/01/20  1944     History Chief Complaint  Patient presents with  . Chest Injury    Victor Jefferson is a 45 y.o. male.  45 year old male with prior medical history as detailed below presents for evaluation.  Patient with longstanding history of multiple pneumothoraces.  Patient reports onset of right-sided chest discomfort consistent with last pneumothorax.  He presented to urgent care earlier today with confirmation of right-sided pneumothorax.  Patient is known to cardiothoracic - Dr. Dorris Fetch.  Patient reports minimal discomfort on the right chest with deep breath.  He denies significant shortness of breath.  He denies fever.  The history is provided by the patient and medical records.  Illness Location:  Right-sided pneumothorax Severity:  Moderate Onset quality:  Gradual Duration:  1 day Timing:  Constant Progression:  Unchanged Chronicity:  Recurrent      Past Medical History:  Diagnosis Date  . Eczema   . Spontaneous pneumothorax     Patient Active Problem List   Diagnosis Date Noted  . Spontaneous pneumothorax 12/01/2020  . Recurrent spontaneous pneumothorax 10/31/2019  . S/P lung surgery, follow-up exam 08/13/2015  . Pneumothorax on right 08/10/2015    Past Surgical History:  Procedure Laterality Date  . MASS EXCISION Right 09/13/2016   Procedure: EXCISION OF RIGHT CHEEK SEBACEOUS CYST;  Surgeon: Alena Bills Dillingham, DO;  Location: [facility];  Service: Plastics;  Laterality: Right;  . STAPLING OF BLEBS Right 08/13/2015   Procedure: BLEBECTOMY;  Surgeon: Loreli Slot, MD;  Location: [facility] OR;  Service: Thoracic;  Laterality: Right;  [name] VIDEO ASSISTED THORACOSCOPY Right 08/13/2015   Procedure: VIDEO ASSISTED THORACOSCOPY, plerual abrasion;  Surgeon: Loreli Slot, MD;  Location: [facility] OR;  Service: Thoracic;  Laterality: Right;       Family  History  Problem Relation Age of Onset  . Healthy Mother   . Cancer Father     Social History   Tobacco Use  . Smoking status: Current Every Day Smoker    Years: 18.00    Types: E-cigarettes  . Smokeless tobacco: Never Used  Substance Use Topics  . Alcohol use: Yes    Alcohol/week: 6.0 standard drinks    Types: 6 Cans of beer per week  . Drug use: No    Home Medications Prior to Admission medications   Medication Sig Start Date End Date Taking? Authorizing Provider  acetaminophen (TYLENOL) 325 MG tablet Take 650 mg by mouth every 6 (six) hours as needed.    [provider]  ondansetron (ZOFRAN) 4 MG tablet Take 1 tablet (4 mg total) by mouth every 6 (six) hours. 12/17/19   Moshe Cipro, NP    Allergies    Latex and Charentais melon (french melon)  Review of Systems   Review of Systems  All other systems reviewed and are negative.   Physical Exam Updated Vital Signs BP (!) 120/95   Pulse 78   Temp 97.9 F (36.6 C) (Oral)   Resp 20   Ht 5\' 11"  (1.803 m)   Wt 61.2 kg   SpO2 100%   BMI 18.82 kg/m   Physical Exam Vitals and nursing note reviewed.  Constitutional:      General: He is not in acute distress.    Appearance: He is well-developed.  HENT:     Head: Normocephalic and atraumatic.  Eyes:     Conjunctiva/sclera: Conjunctivae normal.  Pupils: Pupils are equal, round, and reactive to light.  Cardiovascular:     Rate and Rhythm: Normal rate and regular rhythm.     Heart sounds: Normal heart sounds.  Pulmonary:     Effort: Pulmonary effort is normal. No respiratory distress.     Comments: Decreased breath sounds on right Abdominal:     General: There is no distension.     Palpations: Abdomen is soft.     Tenderness: There is no abdominal tenderness.  Musculoskeletal:        General: No deformity. Normal range of motion.     Cervical back: Normal range of motion and neck supple.  Skin:    General: Skin is warm and dry.   Neurological:     Mental Status: He is alert and oriented to person, place, and time.     ED Results / Procedures / Treatments   Labs (all labs ordered are listed, but only abnormal results are displayed) Labs Reviewed  RESP PANEL BY RT-PCR (FLU A&B, COVID) ARPGX2  BASIC METABOLIC PANEL  CBC WITH DIFFERENTIAL/PLATELET  PROTIME-INR    EKG None  Radiology DG Chest 2 View  Result Date: 12/01/2020 CLINICAL DATA:  Shortness of breath, chest pain EXAM: CHEST - 2 VIEW COMPARISON:  12/18/2019 FINDINGS: Heart size is within normal limits. No appreciable shift of the heart or mediastinal structures. Trachea is midline. Large right-sided pneumothorax involving 50-75% of the lung volume. Postsurgical changes within the right suprahilar region. Left lung is clear. No left-sided pneumothorax. IMPRESSION: Large right-sided pneumothorax involving 50-75% of the lung volume. No evidence of tension component. These results will be called to the ordering clinician or representative by the Radiologist Assistant, and communication documented in the PACS or [employer]. Electronically Signed   By: Duanne Guess D.O.   On: 12/01/2020 19:32   DG Chest Port 1 View  Result Date: 12/01/2020 CLINICAL DATA:  Follow-up right pneumothorax EXAM: PORTABLE CHEST 1 VIEW COMPARISON:  Chest radiograph from earlier today. FINDINGS: Stable cardiomediastinal silhouette with normal heart size. Persistent large right pneumothorax, approximately 70%, not substantially changed. No left pneumothorax. No mediastinal shift. No pleural effusion. Surgical clips and sutures overlie upper right lung. Moderate to severe atelectasis of the right lung, unchanged. Clear left lung. IMPRESSION: 1. Stable large right pneumothorax with moderate to severe right lung atelectasis. No mediastinal shift. 2. Stable postsurgical changes in the upper right lung. Electronically Signed   By: Delbert Phenix M.D.   On: 12/01/2020 20:28     Procedures CHEST TUBE INSERTION  Date/Time: 12/01/2020 10:24 PM Performed by: Wynetta Fines, MD Authorized by: Wynetta Fines, MD   Consent:    Consent obtained:  Written and verbal   Consent given by:  Patient   Risks, benefits, and alternatives were discussed: yes     Risks discussed:  Bleeding, damage to surrounding structures, infection, incomplete drainage, nerve damage and pain   Alternatives discussed:  No treatment Universal protocol:    Immediately prior to procedure, a time out was called: yes     Patient identity confirmed:  Verbally with patient Pre-procedure details:    Skin preparation:  Povidone-iodine and chlorhexidine   Preparation: Patient was prepped and draped in the usual sterile fashion   Sedation:    Sedation type:  None Anesthesia:    Anesthesia method:  Local infiltration   Local anesthetic:  Lidocaine 2% WITH epi Procedure details:    Placement location:  R lateral   Tube size [place]):  14.   Tension pneumothorax: no     Tube connected to:  Water seal and suction   Drainage characteristics:  Air only   Suture material:  2-0 silk   Dressing:  4x4 sterile gauze Post-procedure details:    Post-insertion x-ray findings: tube in good position     Procedure completion:  Tolerated    CRITICAL CARE Performed by: Wynetta Fines   Total critical care time: 45 minutes  Critical care time was exclusive of separately billable procedures and treating other patients.  Critical care was necessary to treat or prevent imminent or life-threatening deterioration.  Critical care was time spent personally by me on the following activities: development of treatment plan with patient and/or surrogate as well as nursing, discussions with consultants, evaluation of patient's response to treatment, examination of patient, obtaining history from patient or surrogate, ordering and performing treatments and interventions, ordering and review of laboratory studies,  ordering and review of radiographic studies, pulse oximetry and re-evaluation of patient's condition.   Medications Ordered in ED Medications  enoxaparin (LOVENOX) injection 40 mg (has no administration in time range)  acetaminophen (TYLENOL) tablet 1,000 mg (has no administration in time range)  ketorolac (TORADOL) 15 MG/ML injection 15 mg (has no administration in time range)  morphine 2 MG/ML injection 2 mg (has no administration in time range)  oxyCODONE (Oxy IR/ROXICODONE) immediate release tablet 5 mg (has no administration in time range)  ondansetron (ZOFRAN) tablet 4 mg (has no administration in time range)  lidocaine-EPINEPHrine (XYLOCAINE W/EPI) 2 %-1:200000 (PF) injection 10 mL (10 mLs Infiltration Given by Other 12/01/20 2031)  morphine 4 MG/ML injection 4 mg (4 mg Intravenous Given 12/01/20 2031)    ED Course  I have reviewed the triage vital signs and the nursing notes.  Pertinent labs & imaging results that were available during my care of the patient were reviewed by me and considered in my medical decision making (see chart for details).    MDM Rules/Calculators/A&P                          MDM  MSE complete  Jishnu Jenniges was evaluated in Emergency Department on 12/01/2020 for the symptoms described in the history of present illness. He was evaluated in the context of the global COVID-19 pandemic, which necessitated consideration that the patient might be at risk for infection with the SARS-CoV-2 virus that causes COVID-19. Institutional protocols and algorithms that pertain to the evaluation of patients at risk for COVID-19 are in a state of rapid change based on information released by regulatory bodies including the CDC and federal and state organizations. These policies and algorithms were followed during the patient's care in the ED.  Patient presents with recurrent right-sided pneumothorax.  14 French pigtail catheter placed without difficulty on right.  Patient  tolerated this procedure well.  Dr. Dorris Fetch of Cardiothoracic surgery is aware of case and will admit for further work-up and treatment.   Final Clinical Impression(s) / ED Diagnoses Final diagnoses:  Pneumothorax on right    Rx / DC Orders ED Discharge Orders    None       Wynetta Fines, MD 12/01/20 2226

## 2020-12-01 NOTE — ED Triage Notes (Signed)
Patient arrives with [facility] from UC, patient with hx of pneumothorax x 5, patient reports he felt like it happened yesterday, went to UC where they confirmed a R sided pneumo, patient placed on 2L [place] at [facility].

## 2020-12-02 ENCOUNTER — Inpatient Hospital Stay (HOSPITAL_COMMUNITY): Payer: BLUE CROSS/BLUE SHIELD

## 2020-12-02 DIAGNOSIS — J9383 Other pneumothorax: Secondary | ICD-10-CM

## 2020-12-02 LAB — CBC
HCT: 43.8 % (ref 39.0–52.0)
Hemoglobin: 14.6 g/dL (ref 13.0–17.0)
MCH: 30.7 pg (ref 26.0–34.0)
MCHC: 33.3 g/dL (ref 30.0–36.0)
MCV: 92 fL (ref 80.0–100.0)
Platelets: 324 10*3/uL (ref 150–400)
RBC: 4.76 MIL/uL (ref 4.22–5.81)
RDW: 13.1 % (ref 11.5–15.5)
WBC: 7.3 10*3/uL (ref 4.0–10.5)
nRBC: 0 % (ref 0.0–0.2)

## 2020-12-02 LAB — COMPREHENSIVE METABOLIC PANEL
ALT: 22 U/L (ref 0–44)
AST: 26 U/L (ref 15–41)
Albumin: 3.7 g/dL (ref 3.5–5.0)
Alkaline Phosphatase: 68 U/L (ref 38–126)
Anion gap: 9 (ref 5–15)
BUN: 15 mg/dL (ref 6–20)
CO2: 28 mmol/L (ref 22–32)
Calcium: 8.6 mg/dL — ABNORMAL LOW (ref 8.9–10.3)
Chloride: 100 mmol/L (ref 98–111)
Creatinine, Ser: 1.07 mg/dL (ref 0.61–1.24)
GFR, Estimated: >60 mL/min (ref >60)
Glucose, Bld: 133 mg/dL — ABNORMAL HIGH (ref 70–99)
Potassium: 3.9 mmol/L (ref 3.5–5.1)
Sodium: 137 mmol/L (ref 135–145)
Total Bilirubin: 1 mg/dL (ref 0.3–1.2)
Total Protein: 6.9 g/dL (ref 6.5–8.1)

## 2020-12-02 LAB — PROTIME-INR
INR: 1 (ref 0.8–1.2)
Prothrombin Time: 13 seconds (ref 11.4–15.2)

## 2020-12-02 LAB — TYPE AND SCREEN
ABO/RH(D): O POS
Antibody Screen: NEGATIVE

## 2020-12-02 LAB — APTT: aPTT: 28 seconds (ref 24–36)

## 2020-12-02 MED ORDER — CEFAZOLIN SODIUM-DEXTROSE 2-4 GM/100ML-% IV SOLN
2.0000 g | INTRAVENOUS | Status: AC
  Administered 2020-12-03: 2 g via INTRAVENOUS
  Filled 2020-12-02 (×2): qty 100

## 2020-12-02 NOTE — Progress Notes (Signed)
Patient admitted to 4E. VS are stable. CHG bath given. Pt is A/O x4. Chest tube connected to suction via order.

## 2020-12-02 NOTE — ED Notes (Signed)
Brodi Kari father [phone] would like an update on the patient

## 2020-12-02 NOTE — Plan of Care (Signed)

## 2020-12-02 NOTE — H&P (Addendum)
[address]            [place] [id number]          [phone]     Victor Jefferson is an 45 y.o. male. 12/01/1975 [id number]   Chief Complaint: Shortness of breath, chest pain, dizziness.    History of Presenting Illness:  [name] is a 45 year old male with a past history of tobacco abuse, hepatitis B,  and recurrent right spontaneous pneumothoraces x3.  He underwent a right VATS with blebectomy and pleural abrasion in 2017 after his second spontaneous pneumothorax.  He did well until 10/31/2019 when he presented with a 4-day history of shortness of breath and chest discomfort.  He was found to have recurrent pneumothorax.  Chest tube was placed.  The right lung expanded.  Re do surgery for blebectomy was recommended at that time but he declined.  After the airleak resolved, the chest tube was removed and he was discharged home on 11/04/2019.   [name] presented to the [facility] urgent care center last evening with complaint of shortness of breath associated with chest discomfort and dizziness.  Chest x-ray demonstrated a right pneumothorax.  [facility].  After arrival by EMS, a right pleural pigtail catheter was placed by the ED physician.  This resulted in resolution of the lower lung zone pneumothorax but he continued to have small residual apical pneumothorax.  CT surgery was consulted.  Patient has been admitted to our service but remains in the ED awaiting room placement. 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   Past Medical History: Past Medical History:  Diagnosis Date  . Eczema   . Spontaneous pneumothorax     Past Surgical History: Past Surgical History:  Procedure Laterality Date  . MASS EXCISION Right 09/13/2016   Procedure: EXCISION OF RIGHT CHEEK SEBACEOUS CYST;  Surgeon: Alena Billslaire S  Dillingham, DO;  Location: [facility] OR;  Service: Plastics;  Laterality: Right;  . STAPLING OF BLEBS Right 08/13/2015   Procedure: BLEBECTOMY;  Surgeon: Loreli SlotSteven C Paullette Mckain, MD;  [facility];  Service: Thoracic;  Laterality: Right;  [name]. VIDEO ASSISTED THORACOSCOPY Right 08/13/2015   Procedure: VIDEO ASSISTED THORACOSCOPY, plerual abrasion;  Surgeon: Loreli SlotSteven C Shimon Trowbridge, MD;  [facility];  Service: Thoracic;  Laterality: Right;    Family History: Family History  Problem Relation Age of Onset  . Healthy Mother   . Cancer Father      Social History:   reports that he has been smoking e-cigarettes. He has smoked for the past 18.00 years. He has never used smokeless tobacco. He reports current alcohol use of about 6.0 standard drinks of alcohol per week. He reports that he does not use drugs.  Allergies:  Allergies  Allergen Reactions  . Charentais Melon (French Melon) Itching and Rash    Allergic to raw melons  Allergic to raw melons  . Latex Other (See Comments)    Burning ,itchy     (Not in a hospital admission)    Review of Systems:  Cardiac Review of Systems: Y or N  Chest Pain [ y ] Resting SOB [ y ] Exertional SOB [ y ] Orthopnea y[n ]  Pedal Edema y[n ] Palpitations [n ] Syncope [ ]   Presyncope [ n ]  General Review of Systems: [Y] = yes [ ] =no  Constitional: recent weight change [n ]; anorexia [n ]; fatigue [n ]; nausea [n ]; night sweats [n ]; fever [n]; or chills ];  Eye : blurred vision [n ]; diplopia Milo.Brash ]; vision changes [n ]; Amaurosis fugax[n ];  Resp: cough [n ]; wheezing[ ] ; hemoptysis[n ]; shortness of breath[y ]; paroxysmal nocturnal dyspnea[n ]; dyspnea on exertion[y ]; or orthopnea[n ];  GI: gallstones[n ], vomiting[n ]; dysphagia[n ]; melena[ n]; hematochezia [n ]; heartburn[ n]; Hx of Colonoscopy[n ];  GU: kidney stones [n ]; hematuria[n ]; dysuria [ n]; nocturia[n ]; history of obstruction [n ];  Skin: rash, swelling[n ];, hair loss[n ]; peripheral  edema[nn ]; or itching[ ] ;  Musculosketetal: myalgias[n ]; joint swelling[n ]; joint erythema[n ];  joint pain[n ]; back pain[n ];  Heme/Lymph: bruising[n ]; bleeding[n ]; anemia[n ];  Neuro: TIA[n ]; headaches[n ]; stroke[ ] ; vertigo[n ]; seizures[n ]; paresthesias[n ]; difficulty walking[n ];  Psych:depression n ]; anxiety[ n];  Endocrine: diabetes[n ]; thyroid dysfunction[n ];  Immunizations: Flu [n ]; Pneumococcal[n ];  Other:  BP 120/62   Pulse 64   Temp 97.9 F (36.6 C) (Oral)   Resp 14   Ht 5\' 11"  (1.803 m)   Wt 61.2 kg   SpO2 100%   BMI 18.82 kg/m   Physical Exam: General appearance: alert, cooperative and no distress Neurologic: intact Heart: regular rate and rhythm, S1, S2 normal, no murmur, click, rub or gallop Lungs: clear to auscultation bilaterally and A pleural pigtail catheter exits the right lateral chest and is well secured and is connected to a PleurEvac at -20cmH2O suction. There is no air leak. Abdomen: firm but not tender Extremities: extremities normal, atraumatic, no cyanosis or edema   Diagnostic Studies and Laboratory Results: Results for orders placed or performed during the hospital encounter of 12/01/20 (from the past 48 hour(s))  Basic metabolic panel     Status: None   Collection Time: 12/01/20  7:55 PM  Result Value Ref Range   Sodium 138 135 - 145 mmol/L   Potassium 4.2 3.5 - 5.1 mmol/L   Chloride 100 98 - 111 mmol/L   CO2 30 22 - 32 mmol/L   Glucose, Bld 81 70 - 99 mg/dL    Comment: Glucose reference range applies only to samples taken after fasting for at least 8 hours.   BUN 12 6 - 20 mg/dL   Creatinine, Ser 01/31/21 0.61 - 1.24 mg/dL   Calcium 9.1 8.9 - 01/31/21 mg/dL   GFR, Estimated 5.36 64.4 mL/min    Comment: (NOTE) Calculated using the CKD-EPI Creatinine Equation (2021)    Anion gap 8 5 - 15    Comment: Performed at [facility], [address] [place], [address] [place]  CBC with Differential     Status: None   Collection  Time: 12/01/20  7:55 PM  Result Value Ref Range   WBC 7.1 4.0 - 10.5 K/uL   RBC 5.28 4.22 - 5.81 MIL/uL   Hemoglobin 16.2 13.0 - 17.0 g/dL   HCT 42595 01/31/21 - 63.8 %   MCV 92.4 80.0 - 100.0 fL   MCH 30.7 26.0 - 34.0 pg   MCHC 33.2 30.0 - 36.0 g/dL   RDW 75.6 43.3 - 29.5 %   Platelets 383 150 - 400 K/uL   nRBC 0.0 0.0 - 0.2 %   Neutrophils Relative % 59 %  Neutro Abs 4.1 1.7 - 7.7 K/uL   Lymphocytes Relative 27 %   Lymphs Abs 1.9 0.7 - 4.0 K/uL   Monocytes Relative 12 %   Monocytes Absolute 0.9 0.1 - 1.0 K/uL   Eosinophils Relative 2 %   Eosinophils Absolute 0.1 0.0 - 0.5 K/uL   Basophils Relative 0 %   Basophils Absolute 0.0 0.0 - 0.1 K/uL   Immature Granulocytes 0 %   Abs Immature Granulocytes 0.03 0.00 - 0.07 K/uL    Comment: Performed at [facility], [address] [place], [place] [address]  Protime-INR     Status: None   Collection Time: 12/01/20  7:55 PM  Result Value Ref Range   Prothrombin Time 12.7 11.4 - 15.2 seconds   INR 1.0 0.8 - 1.2    Comment: (NOTE) INR goal varies based on device and disease states. Performed at [facility], [address], [place], [place] [address]   Resp Panel by RT-PCR (Flu A&B, Covid) Nasopharyngeal Swab     Status: None   Collection Time: 12/01/20  8:07 PM   Specimen: Nasopharyngeal Swab; Nasopharyngeal(NP) swabs in vial transport medium  Result Value Ref Range   SARS Coronavirus 2 by RT PCR NEGATIVE NEGATIVE    Comment: (NOTE) SARS-CoV-2 target nucleic acids are NOT DETECTED.  The SARS-CoV-2 RNA is generally detectable in upper respiratory specimens during the acute phase of infection. The lowest concentration of SARS-CoV-2 viral copies this assay can detect is 138 copies/mL. A negative result does not preclude SARS-Cov-2 infection and should not be used as the sole basis for treatment or other patient management decisions. A negative result may occur with  improper specimen collection/handling, submission of  specimen other than nasopharyngeal swab, presence of viral mutation(s) within the areas targeted by this assay, and inadequate number of viral copies(<138 copies/mL). A negative result must be combined with clinical observations, patient history, and epidemiological information. The expected result is Negative.  Fact Sheet for Patients:  BloggerCourse.com  Fact Sheet for Healthcare Providers:  SeriousBroker.it  This test is no t yet approved or cleared by the [place] FDA and  has been authorized for detection and/or diagnosis of SARS-CoV-2 by FDA under an Emergency Use Authorization (EUA). This EUA will remain  in effect (meaning this test can be used) for the duration of the COVID-19 declaration under Section 564(b)(1) of the Act, 21 U.S.C.section 360bbb-3(b)(1), unless the authorization is terminated  or revoked sooner.       Influenza A by PCR NEGATIVE NEGATIVE   Influenza B by PCR NEGATIVE NEGATIVE    Comment: (NOTE) The Xpert Xpress SARS-CoV-2/FLU/RSV plus assay is intended as an aid in the diagnosis of influenza from Nasopharyngeal swab specimens and should not be used as a sole basis for treatment. Nasal washings and aspirates are unacceptable for Xpert Xpress SARS-CoV-2/FLU/RSV testing.  Fact Sheet for Patients: BloggerCourse.com  Fact Sheet for Healthcare Providers: SeriousBroker.it  This test is not yet approved or cleared by the [place] FDA and has been authorized for detection and/or diagnosis of SARS-CoV-2 by FDA under an Emergency Use Authorization (EUA). This EUA will remain in effect (meaning this test can be used) for the duration of the COVID-19 declaration under Section 564(b)(1) of the Act, 21 U.S.C. section 360bbb-3(b)(1), unless the authorization is terminated or revoked.  Performed at [facility], [address], [place],  [place] [address]    DG Chest 2 View  Result Date: 12/01/2020 CLINICAL DATA:  Shortness of breath, chest pain EXAM: CHEST - 2 VIEW COMPARISON:  12/18/2019 FINDINGS: Heart size is within normal limits. No appreciable shift of the heart or mediastinal structures. Trachea is midline. Large right-sided pneumothorax involving 50-75% of the lung volume. Postsurgical changes within the right suprahilar region. Left lung is clear. No left-sided pneumothorax. IMPRESSION: Large right-sided pneumothorax involving 50-75% of the lung volume. No evidence of tension component. These results will be called to the ordering clinician or representative by the Radiologist Assistant, and communication documented in the PACS or [employer]. Electronically Signed   By: Duanne Guess D.O.   On: 12/01/2020 19:32   CT CHEST WO CONTRAST  Result Date: 12/02/2020 CLINICAL DATA:  Pneumothorax follow-up, history of RIGHT middle lobe nodule in a 45 year old male. Chest tube inserted on 12/01/2020. EXAM: CT CHEST WITHOUT CONTRAST TECHNIQUE: Multidetector CT imaging of the chest was performed following the standard protocol without IV contrast. COMPARISON:  Multiple prior chest x-rays. CT of the chest of Nov 01, 2019. FINDINGS: Cardiovascular: Heart size is stable. Aortic caliber is normal. Cyst caliber of central pulmonary vessels is unremarkable. Limited assessment of cardiovascular structures given lack of intravenous contrast. Mediastinum/Nodes: No adenopathy in the chest. Noncontrast appearance of mediastinal structures is unremarkable. Lungs/Pleura: Interval decompression of much of the RIGHT-sided pneumothorax with small residual apical component. 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 Postoperative changes of wedge resection in the RIGHT upper lobe with similar appearance to prior imaging. Small bore RIGHT-sided chest tube in place. Sideholes within the RIGHT chest cavity. Decreased size of the  nodular density at the RIGHT lung apex compared to the previous study measuring 9 x 7 mm as compared to 17 x 10 mm on the previous exam. This is bandlike on the coronal images. Small anterior component of the pneumothorax is noted at the inferior aspect of the chest on the RIGHT. The RIGHT-sided chest tube enters via lateral approach in terminates in the minor fissure in the RIGHT chest. Bullous disease in the LEFT chest. No consolidation. No pleural effusion. Upper Abdomen: Incidental imaging of upper abdominal contents without acute process. Musculoskeletal: No acute or destructive bone process. IMPRESSION: 1. Interval decompression of much of the RIGHT-sided pneumothorax with small residual apical and anterior component. Small bore RIGHT-sided chest tube in place. 2. RIGHT-sided chest tube passes into the RIGHT chest with the tip of the catheter in the minor fissure. 3. Nodular density at the RIGHT lung apex seen on the previous study now more bandlike on coronal images and diminished in size likely resolving scarring. 4. 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 5. Pulmonary emphysema. Emphysema (ICD10-J43.9). Electronically Signed   By: Donzetta Kohut M.D.   On: 12/02/2020 09:19   DG Chest Port 1 View  Result Date: 12/02/2020 CLINICAL DATA:  Pneumothorax on the right EXAM: PORTABLE CHEST 1 VIEW COMPARISON:  Yesterday FINDINGS: Right chest tube in stable position. Unchanged small right apical pneumothorax. Postoperative right apical lung. Normal heart size and mediastinal contours. Left apical blebs. IMPRESSION: Unchanged right chest tube and small apical pneumothorax. Electronically Signed   By: Marnee Spring M.D.   On: 12/02/2020 05:44   DG Chest Portable 1 View  Result Date: 12/01/2020 CLINICAL DATA:  Chest tube placement. EXAM: PORTABLE CHEST 1 VIEW COMPARISON:  December 01, 2020 (8:00 p.m.) FINDINGS: The lungs are hyperinflated. There is a right-sided chest tube  with its distal end overlying the mid to  upper right lung. Mild atelectasis is noted within the right lung base. There is no evidence of a pleural effusion. A small residual right apical pneumothorax is seen. The heart size and mediastinal contours are within normal limits. The visualized skeletal structures are unremarkable. IMPRESSION: Interval right-sided chest tube placement and positioning, as described above, with a small residual right apical pneumothorax. Electronically Signed   By: Aram Candela M.D.   On: 12/01/2020 22:23   DG Chest Port 1 View  Result Date: 12/01/2020 CLINICAL DATA:  Follow-up right pneumothorax EXAM: PORTABLE CHEST 1 VIEW COMPARISON:  Chest radiograph from earlier today. FINDINGS: Stable cardiomediastinal silhouette with normal heart size. Persistent large right pneumothorax, approximately 70%, not substantially changed. No left pneumothorax. No mediastinal shift. No pleural effusion. Surgical clips and sutures overlie upper right lung. Moderate to severe atelectasis of the right lung, unchanged. Clear left lung. IMPRESSION: 1. Stable large right pneumothorax with moderate to severe right lung atelectasis. No mediastinal shift. 2. Stable postsurgical changes in the upper right lung. Electronically Signed   By: Delbert Phenix M.D.   On: 12/01/2020 20:28     Assessment/Plan  -45 year old male with recurrent spontaneous right pneumothorax.  He is status post right VATS with blebectomy and pleural abrasion in 2017. He smoked for 18 years but says he has not smoked since his last PTX in 2021.   Chest CT scan this morning demonstrates significant bullous disease in both lungs.  The right pneumothorax has been mostly decompressed after placement of the right pleural pigtail catheter by the ED physician.  Mr. Toothman respiratory status is stable.  Right robotic-assisted blebectomy has been discussed with the patient by Dr. Dorris Fetch and Mr. Zenner agrees to proceed.  We have  tentatively plan for surgery tomorrow.  Leary Roca, PA-C 12/02/2020, 9:25 AM  Patient seen and examined.  Mr. Strollo is a 45 year old man who I know well.  He has a history of hepatitis B, tobacco abuse and recurrent right pneumothoraces.  He had recurrent spontaneous right pneumothorax in 2017 and I did a blebectomy and pleural abrasion.  He did well for a while but presented back with a recurrent pneumothorax again in 2021.  We discussed redo surgery at that time but he was reluctant to do so.  He did well until the day before yesterday when he developed recurrent chest discomfort and shortness of breath.  He finally went to an urgent care yesterday and was found to have a right pneumothorax.  He was sent to the emergency room and had a pigtail catheter placed by the emergency room physician.  I resulted in reexpansion of the lung.  He does not currently have an air leak.  Fortunately he has quit smoking.  Options include conservative management with removal of the tube after air leak resolves versus more aggressive intervention with robotic right VATS for resection of blebs and pleurodesis.  I favor proceeding with surgery as I think he is going to be at extremely high risk for recurrent pneumothoraces.  I explained to him the general nature of the procedure including the incisions to be used, the need for general anesthesia, the use of a drainage tube postoperatively, the expected hospital stay, and the overall recovery.  He understands the indications, risks, benefits, and alternatives.  He understands there is no guarantee against having another pneumothorax, but the surgery would significantly reduce the chance.  He understands the risks include, but are not limited to death, MI, DVT, PE, bleeding, possible need  for transfusion, infection, recurrent pneumothorax, prolonged air leak, as well as possibility of other unforeseeable complications.  Salvatore Decent Dorris Fetch, MD [facility] [phone]

## 2020-12-02 NOTE — ED Notes (Signed)
Pt resting at this time. VS WNL. Visible chest rise and fall noted. Pt appears in NAD.  

## 2020-12-03 ENCOUNTER — Encounter (HOSPITAL_COMMUNITY): Payer: Self-pay | Admitting: Thoracic Surgery (Cardiothoracic Vascular Surgery)

## 2020-12-03 ENCOUNTER — Inpatient Hospital Stay (HOSPITAL_COMMUNITY): Payer: BLUE CROSS/BLUE SHIELD | Admitting: Anesthesiology

## 2020-12-03 ENCOUNTER — Inpatient Hospital Stay (HOSPITAL_COMMUNITY): Payer: BLUE CROSS/BLUE SHIELD

## 2020-12-03 ENCOUNTER — Encounter (HOSPITAL_COMMUNITY)
Admission: EM | Disposition: A | Discharge status: Home / Self Care | Payer: Self-pay | Source: Home / Self Care | Attending: Thoracic Surgery (Cardiothoracic Vascular Surgery)

## 2020-12-03 DIAGNOSIS — Z9889 Other specified postprocedural states: Secondary | ICD-10-CM

## 2020-12-03 DIAGNOSIS — J9383 Other pneumothorax: Secondary | ICD-10-CM

## 2020-12-03 HISTORY — PX: INTERCOSTAL NERVE BLOCK: SHX5021

## 2020-12-03 HISTORY — PX: STAPLING OF BLEBS: SHX6429

## 2020-12-03 LAB — GLUCOSE, CAPILLARY: Glucose-Capillary: 107 mg/dL — ABNORMAL HIGH (ref 70–99)

## 2020-12-03 SURGERY — XI ROBOTIC ASSISTED THORACOSCOPY - DNU
Anesthesia: General | Site: Chest | Laterality: Right

## 2020-12-03 MED ORDER — FENTANYL CITRATE (PF) 250 MCG/5ML IJ SOLN
INTRAMUSCULAR | Status: AC
  Filled 2020-12-03: qty 5

## 2020-12-03 MED ORDER — BUPIVACAINE HCL (PF) 0.5 % IJ SOLN
INTRAMUSCULAR | Status: AC
  Filled 2020-12-03: qty 30

## 2020-12-03 MED ORDER — DEXAMETHASONE SODIUM PHOSPHATE 10 MG/ML IJ SOLN
INTRAMUSCULAR | Status: DC | PRN
  Administered 2020-12-03: 10 mg via INTRAVENOUS

## 2020-12-03 MED ORDER — ROCURONIUM BROMIDE 10 MG/ML (PF) SYRINGE
PREFILLED_SYRINGE | INTRAVENOUS | Status: AC
  Filled 2020-12-03: qty 10

## 2020-12-03 MED ORDER — LIDOCAINE 2% (20 MG/ML) 5 ML SYRINGE
INTRAMUSCULAR | Status: DC | PRN
  Administered 2020-12-03: 68 mg via INTRAVENOUS

## 2020-12-03 MED ORDER — MIDAZOLAM HCL 5 MG/5ML IJ SOLN
INTRAMUSCULAR | Status: DC | PRN
  Administered 2020-12-03: 2 mg via INTRAVENOUS

## 2020-12-03 MED ORDER — HYDROMORPHONE HCL 1 MG/ML IJ SOLN
INTRAMUSCULAR | Status: AC
  Filled 2020-12-03: qty 1

## 2020-12-03 MED ORDER — KETOROLAC TROMETHAMINE 15 MG/ML IJ SOLN
15.0000 mg | Freq: Four times a day (QID) | INTRAMUSCULAR | Status: AC
  Administered 2020-12-04 – 2020-12-08 (×18): 15 mg via INTRAVENOUS
  Filled 2020-12-03 (×19): qty 1

## 2020-12-03 MED ORDER — TRAMADOL HCL 50 MG PO TABS
50.0000 mg | ORAL_TABLET | Freq: Four times a day (QID) | ORAL | Status: DC | PRN
  Administered 2020-12-03: 100 mg via ORAL
  Filled 2020-12-03: qty 2

## 2020-12-03 MED ORDER — ROCURONIUM BROMIDE 10 MG/ML (PF) SYRINGE
PREFILLED_SYRINGE | INTRAVENOUS | Status: DC | PRN
  Administered 2020-12-03: 20 mg via INTRAVENOUS
  Administered 2020-12-03: 60 mg via INTRAVENOUS
  Administered 2020-12-03: 20 mg via INTRAVENOUS

## 2020-12-03 MED ORDER — HYDROMORPHONE HCL 1 MG/ML IJ SOLN
0.2500 mg | INTRAMUSCULAR | Status: DC | PRN
  Administered 2020-12-03: 0.5 mg via INTRAVENOUS

## 2020-12-03 MED ORDER — CEFAZOLIN SODIUM-DEXTROSE 2-4 GM/100ML-% IV SOLN
2.0000 g | Freq: Three times a day (TID) | INTRAVENOUS | Status: AC
  Administered 2020-12-03 – 2020-12-04 (×2): 2 g via INTRAVENOUS
  Filled 2020-12-03 (×2): qty 100

## 2020-12-03 MED ORDER — BISACODYL 5 MG PO TBEC
10.0000 mg | DELAYED_RELEASE_TABLET | Freq: Every day | ORAL | Status: DC
  Administered 2020-12-04 – 2020-12-11 (×3): 10 mg via ORAL
  Filled 2020-12-03 (×4): qty 2

## 2020-12-03 MED ORDER — PROPOFOL 10 MG/ML IV BOLUS
INTRAVENOUS | Status: AC
  Filled 2020-12-03: qty 20

## 2020-12-03 MED ORDER — PROPOFOL 10 MG/ML IV BOLUS
INTRAVENOUS | Status: DC | PRN
  Administered 2020-12-03: 200 mg via INTRAVENOUS

## 2020-12-03 MED ORDER — DEXAMETHASONE SODIUM PHOSPHATE 10 MG/ML IJ SOLN
INTRAMUSCULAR | Status: AC
  Filled 2020-12-03: qty 1

## 2020-12-03 MED ORDER — SODIUM CHLORIDE 0.9 % IV SOLN: INTRAVENOUS | Status: DC

## 2020-12-03 MED ORDER — CHLORHEXIDINE GLUCONATE 0.12 % MT SOLN: 15.0000 mL | Freq: Once | OROMUCOSAL | Status: AC

## 2020-12-03 MED ORDER — INSULIN ASPART 100 UNIT/ML IJ SOLN: 0.0000 [IU] | Freq: Three times a day (TID) | INTRAMUSCULAR | Status: DC

## 2020-12-03 MED ORDER — ONDANSETRON HCL 4 MG/2ML IJ SOLN
4.0000 mg | Freq: Four times a day (QID) | INTRAMUSCULAR | Status: DC | PRN
  Administered 2020-12-03 – 2020-12-05 (×3): 4 mg via INTRAVENOUS
  Filled 2020-12-03 (×3): qty 2

## 2020-12-03 MED ORDER — SODIUM CHLORIDE FLUSH 0.9 % IV SOLN
INTRAVENOUS | Status: DC | PRN
  Administered 2020-12-03: 100 mL

## 2020-12-03 MED ORDER — ONDANSETRON HCL 4 MG/2ML IJ SOLN
INTRAMUSCULAR | Status: AC
  Filled 2020-12-03: qty 2

## 2020-12-03 MED ORDER — ACETAMINOPHEN 500 MG PO TABS
1000.0000 mg | ORAL_TABLET | Freq: Four times a day (QID) | ORAL | Status: AC
  Administered 2020-12-04 – 2020-12-08 (×14): 1000 mg via ORAL
  Filled 2020-12-03 (×17): qty 2

## 2020-12-03 MED ORDER — MIDAZOLAM HCL 2 MG/2ML IJ SOLN
INTRAMUSCULAR | Status: AC
  Filled 2020-12-03: qty 2

## 2020-12-03 MED ORDER — ORAL CARE MOUTH RINSE: 15.0000 mL | Freq: Once | OROMUCOSAL | Status: AC

## 2020-12-03 MED ORDER — LACTATED RINGERS IV SOLN: INTRAVENOUS | Status: DC

## 2020-12-03 MED ORDER — BUPIVACAINE LIPOSOME 1.3 % IJ SUSP
INTRAMUSCULAR | Status: AC
  Filled 2020-12-03: qty 20

## 2020-12-03 MED ORDER — SUGAMMADEX SODIUM 200 MG/2ML IV SOLN
INTRAVENOUS | Status: DC | PRN
  Administered 2020-12-03: 120 mg via INTRAVENOUS

## 2020-12-03 MED ORDER — SODIUM CHLORIDE 0.9 % IV SOLN
INTRAVENOUS | Status: AC | PRN
  Administered 2020-12-03: 1000 mL via INTRAMUSCULAR

## 2020-12-03 MED ORDER — SENNOSIDES-DOCUSATE SODIUM 8.6-50 MG PO TABS
1.0000 | ORAL_TABLET | Freq: Every day | ORAL | Status: DC
  Administered 2020-12-03 – 2020-12-04 (×2): 1 via ORAL
  Filled 2020-12-03 (×4): qty 1

## 2020-12-03 MED ORDER — FENTANYL CITRATE (PF) 100 MCG/2ML IJ SOLN: 25.0000 ug | INTRAMUSCULAR | Status: DC | PRN

## 2020-12-03 MED ORDER — CHLORHEXIDINE GLUCONATE 0.12 % MT SOLN
OROMUCOSAL | Status: AC
  Administered 2020-12-03: 15 mL via OROMUCOSAL
  Filled 2020-12-03: qty 15

## 2020-12-03 MED ORDER — 0.9 % SODIUM CHLORIDE (POUR BTL) OPTIME
TOPICAL | Status: DC | PRN
  Administered 2020-12-03: 2000 mL

## 2020-12-03 MED ORDER — LIDOCAINE 2% (20 MG/ML) 5 ML SYRINGE
INTRAMUSCULAR | Status: AC
  Filled 2020-12-03: qty 5

## 2020-12-03 MED ORDER — PROMETHAZINE HCL 25 MG/ML IJ SOLN: 6.2500 mg | INTRAMUSCULAR | Status: DC | PRN

## 2020-12-03 MED ORDER — ACETAMINOPHEN 160 MG/5ML PO SOLN: 1000.0000 mg | Freq: Four times a day (QID) | ORAL | Status: AC

## 2020-12-03 MED ORDER — MEPERIDINE HCL 25 MG/ML IJ SOLN: 6.2500 mg | INTRAMUSCULAR | Status: DC | PRN

## 2020-12-03 MED ORDER — ONDANSETRON HCL 4 MG/2ML IJ SOLN
INTRAMUSCULAR | Status: DC | PRN
  Administered 2020-12-03: 4 mg via INTRAVENOUS

## 2020-12-03 MED ORDER — LACTATED RINGERS IV SOLN: INTRAVENOUS | Status: DC | PRN

## 2020-12-03 MED ORDER — ENOXAPARIN SODIUM 40 MG/0.4ML IJ SOSY
40.0000 mg | PREFILLED_SYRINGE | Freq: Every day | INTRAMUSCULAR | Status: DC
  Administered 2020-12-03: 40 mg via SUBCUTANEOUS
  Filled 2020-12-03: qty 0.4

## 2020-12-03 MED ORDER — FENTANYL CITRATE (PF) 100 MCG/2ML IJ SOLN
INTRAMUSCULAR | Status: DC | PRN
  Administered 2020-12-03: 50 ug via INTRAVENOUS
  Administered 2020-12-03: 100 ug via INTRAVENOUS

## 2020-12-03 SURGICAL SUPPLY — 118 items
ADH SKN CLS APL DERMABOND .7 (GAUZE/BANDAGES/DRESSINGS) ×2
APPLIER CLIP ROT 10 11.4 M/L (STAPLE)
APR CLP MED LRG 11.4X10 (STAPLE)
BAG SPEC RTRVL C125 8X14 (MISCELLANEOUS) ×2
BLADE CLIPPER SURG (BLADE) ×2 IMPLANT
CANISTER SUCT 3000ML PPV (MISCELLANEOUS) ×4 IMPLANT
CANNULA REDUC XI 12-8 STAPL (CANNULA) ×3
CANNULA REDUCER 12-8 DVNC XI (CANNULA) ×4 IMPLANT
CATH THORACIC 28FR (CATHETERS) IMPLANT
CATH THORACIC 28FR RT ANG (CATHETERS) IMPLANT
CATH THORACIC 36FR (CATHETERS) IMPLANT
CATH THORACIC 36FR RT ANG (CATHETERS) IMPLANT
CLIP APPLIE ROT 10 11.4 M/L (STAPLE) IMPLANT
CLIP VESOCCLUDE MED 6/CT (CLIP) IMPLANT
CNTNR URN SCR LID CUP LEK RST (MISCELLANEOUS) ×10 IMPLANT
CONN ST 1/4X3/8  BEN (MISCELLANEOUS) ×3
CONN ST 1/4X3/8 BEN (MISCELLANEOUS) IMPLANT
CONN Y 3/8X3/8X3/8  BEN (MISCELLANEOUS) ×3
CONN Y 3/8X3/8X3/8 BEN (MISCELLANEOUS) IMPLANT
CONT SPEC 4OZ STRL OR WHT (MISCELLANEOUS) ×36
DERMABOND ADVANCED (GAUZE/BANDAGES/DRESSINGS) ×1
DERMABOND ADVANCED .7 DNX12 (GAUZE/BANDAGES/DRESSINGS) IMPLANT
DRAIN CHANNEL 28F RND 3/8 FF (WOUND CARE) ×1 IMPLANT
DRAIN CHANNEL 32F RND 10.7 FF (WOUND CARE) IMPLANT
DRAPE ARM DVNC X/XI (DISPOSABLE) ×8 IMPLANT
DRAPE COLUMN DVNC XI (DISPOSABLE) ×2 IMPLANT
DRAPE CV SPLIT W-CLR ANES SCRN (DRAPES) ×3 IMPLANT
DRAPE DA VINCI XI ARM (DISPOSABLE) ×12
DRAPE DA VINCI XI COLUMN (DISPOSABLE) ×3
DRAPE INCISE IOBAN 66X45 STRL (DRAPES) IMPLANT
DRAPE ORTHO SPLIT 77X108 STRL (DRAPES) ×3
DRAPE SURG ORHT 6 SPLT 77X108 (DRAPES) ×2 IMPLANT
DRSG XEROFORM 1X8 (GAUZE/BANDAGES/DRESSINGS) ×1 IMPLANT
ELECT BLADE 6.5 EXT (BLADE) IMPLANT
ELECT REM PT RETURN 9FT ADLT (ELECTROSURGICAL) ×3
ELECTRODE REM PT RTRN 9FT ADLT (ELECTROSURGICAL) ×2 IMPLANT
GAUZE KITTNER 4X5 RF (MISCELLANEOUS) ×2 IMPLANT
GAUZE SPONGE 4X4 12PLY STRL (GAUZE/BANDAGES/DRESSINGS) ×3 IMPLANT
GLOVE TRIUMPH SURG SIZE 7.5 (KITS) ×4 IMPLANT
GOWN STRL REUS W/ TWL LRG LVL3 (GOWN DISPOSABLE) ×4 IMPLANT
GOWN STRL REUS W/ TWL XL LVL3 (GOWN DISPOSABLE) ×6 IMPLANT
GOWN STRL REUS W/TWL 2XL LVL3 (GOWN DISPOSABLE) ×6 IMPLANT
GOWN STRL REUS W/TWL LRG LVL3 (GOWN DISPOSABLE) ×9
GOWN STRL REUS W/TWL XL LVL3 (GOWN DISPOSABLE) ×6
HEMOSTAT SURGICEL 2X14 (HEMOSTASIS) ×8 IMPLANT
IRRIGATION STRYKERFLOW (MISCELLANEOUS) ×2 IMPLANT
IRRIGATOR STRYKERFLOW (MISCELLANEOUS) ×3
IRRIGATOR SUCT 8 DISP DVNC XI (IRRIGATION / IRRIGATOR) IMPLANT
IRRIGATOR SUCTION 8MM XI DISP (IRRIGATION / IRRIGATOR)
KIT BASIN OR (CUSTOM PROCEDURE TRAY) ×3 IMPLANT
KIT SUCTION CATH 14FR (SUCTIONS) IMPLANT
KIT TURNOVER KIT B (KITS) ×3 IMPLANT
LOOP VESSEL SUPERMAXI WHITE (MISCELLANEOUS) IMPLANT
NDL HYPO 25GX1X1/2 BEV (NEEDLE) ×2 IMPLANT
NDL SPNL 18GX3.5 QUINCKE PK (NEEDLE) ×2 IMPLANT
NEEDLE HYPO 25GX1X1/2 BEV (NEEDLE) ×3 IMPLANT
NEEDLE SPNL 18GX3.5 QUINCKE PK (NEEDLE) ×3 IMPLANT
NS IRRIG 1000ML POUR BTL (IV SOLUTION) ×3 IMPLANT
PACK CHEST (CUSTOM PROCEDURE TRAY) ×3 IMPLANT
PAD ARMBOARD 7.5X6 YLW CONV (MISCELLANEOUS) ×6 IMPLANT
PORT ACCESS TROCAR AIRSEAL 12 (TROCAR) IMPLANT
PORT ACCESS TROCAR AIRSEAL 5M (TROCAR) ×1
RELOAD STAPLE 45 3.5 BLU DVNC (STAPLE) IMPLANT
RELOAD STAPLE 60 3.5 BLU DVNC (STAPLE) IMPLANT
RELOAD STAPLE 60 4.3 GRN DVNC (STAPLE) IMPLANT
RELOAD STAPLER 3.5X45 BLU DVNC (STAPLE) ×2 IMPLANT
RELOAD STAPLER 3.5X60 BLU DVNC (STAPLE) ×40 IMPLANT
RELOAD STAPLER 4.3X60 GRN DVNC (STAPLE) ×10 IMPLANT
SCISSORS LAP 5X35 DISP (ENDOMECHANICALS) IMPLANT
SEAL CANN UNIV 5-8 DVNC XI (MISCELLANEOUS) ×4 IMPLANT
SEAL XI 5MM-8MM UNIVERSAL (MISCELLANEOUS) ×6
SEALANT PROGEL (MISCELLANEOUS) IMPLANT
SEALANT SURG COSEAL 4ML (VASCULAR PRODUCTS) IMPLANT
SEALANT SURG COSEAL 8ML (VASCULAR PRODUCTS) IMPLANT
SET TRI-LUMEN FLTR TB AIRSEAL (TUBING) ×1 IMPLANT
SET TUBE SMOKE EVAC HIGH FLOW (TUBING) IMPLANT
SHEARS HARMONIC HDI 20CM (ELECTROSURGICAL) IMPLANT
SPECIMEN JAR MEDIUM (MISCELLANEOUS) IMPLANT
SPONGE INTESTINAL PEANUT (DISPOSABLE) IMPLANT
SPONGE TONSIL TAPE 1 RFD (DISPOSABLE) IMPLANT
STAPLE LINE REINFORCEMENT LAP (STAPLE) ×12 IMPLANT
STAPLER 60 DA VINCI SURE FORM (STAPLE) ×6
STAPLER 60 SUREFORM DVNC (STAPLE) IMPLANT
STAPLER CANNULA SEAL DVNC XI (STAPLE) ×4 IMPLANT
STAPLER CANNULA SEAL XI (STAPLE) ×6
STAPLER RELOAD 3.5X45 BLU DVNC (STAPLE) ×2
STAPLER RELOAD 3.5X45 BLUE (STAPLE) ×3
STAPLER RELOAD 3.5X60 BLU DVNC (STAPLE) ×40
STAPLER RELOAD 3.5X60 BLUE (STAPLE) ×60
STAPLER RELOAD 4.3X60 GREEN (STAPLE) ×15
STAPLER RELOAD 4.3X60 GRN DVNC (STAPLE) ×10
SUT PDS AB 3-0 SH 27 (SUTURE) IMPLANT
SUT PROLENE 4 0 RB 1 (SUTURE)
SUT PROLENE 4-0 RB1 .5 CRCL 36 (SUTURE) IMPLANT
SUT SILK  1 MH (SUTURE) ×3
SUT SILK 1 MH (SUTURE) ×2 IMPLANT
SUT SILK 1 TIES 10X30 (SUTURE) IMPLANT
SUT SILK 2 0 SH (SUTURE) IMPLANT
SUT SILK 2 0SH CR/8 30 (SUTURE) IMPLANT
SUT SILK 3 0 SH 30 (SUTURE) IMPLANT
SUT SILK 3 0SH CR/8 30 (SUTURE) IMPLANT
SUT VIC AB 1 CTX 36 (SUTURE)
SUT VIC AB 1 CTX36XBRD ANBCTR (SUTURE) IMPLANT
SUT VIC AB 2-0 CTX 36 (SUTURE) IMPLANT
SUT VIC AB 3-0 MH 27 (SUTURE) IMPLANT
SUT VIC AB 3-0 X1 27 (SUTURE) ×6 IMPLANT
SUT VICRYL 0 TIES 12 18 (SUTURE) ×3 IMPLANT
SUT VICRYL 0 UR6 27IN ABS (SUTURE) ×6 IMPLANT
SUT VICRYL 2 TP 1 (SUTURE) IMPLANT
SYR 30ML LL (SYRINGE) ×3 IMPLANT
SYSTEM RETRIEVAL ANCHOR 8 (MISCELLANEOUS) ×1 IMPLANT
SYSTEM SAHARA CHEST DRAIN ATS (WOUND CARE) ×3 IMPLANT
TAPE CLOTH 4X10 WHT NS (GAUZE/BANDAGES/DRESSINGS) ×3 IMPLANT
TIP APPLICATOR SPRAY EXTEND 16 (VASCULAR PRODUCTS) IMPLANT
TOWEL GREEN STERILE (TOWEL DISPOSABLE) ×3 IMPLANT
TRAY WAYNE PNEUMOTHORAX 14X18 (TRAY / TRAY PROCEDURE) ×1 IMPLANT
TROCAR XCEL 12X100 BLDLESS (ENDOMECHANICALS) ×3 IMPLANT
WATER STERILE IRR 1000ML POUR (IV SOLUTION) ×3 IMPLANT

## 2020-12-03 NOTE — Anesthesia Procedure Notes (Signed)
Procedure Name: Intubation Date/Time: 12/03/2020 2:54 PM Performed by: Sherlie Ban, RN Pre-anesthesia Checklist: Patient identified, Emergency Drugs available, Suction available and Patient being monitored Patient Re-evaluated:Patient Re-evaluated prior to induction Oxygen Delivery Method: Circle System Utilized Preoxygenation: Pre-oxygenation with 100% oxygen Induction Type: IV induction Ventilation: Mask ventilation without difficulty Laryngoscope Size: Mac and 4 Grade View: Grade I Tube type: Oral Endobronchial tube: Double lumen EBT, EBT position confirmed by auscultation and EBT position confirmed by fiberoptic bronchoscope and 39 Fr Number of attempts: 1 Airway Equipment and Method: Stylet and Fiberoptic brochoscope Placement Confirmation: ETT inserted through vocal cords under direct vision,  positive ETCO2 and breath sounds checked- equal and bilateral Tube secured with: Tape Dental Injury: Teeth and Oropharynx as per pre-operative assessment

## 2020-12-03 NOTE — Anesthesia Preprocedure Evaluation (Addendum)
Anesthesia Evaluation  Patient identified by MRN, date of birth, ID band Patient awake    Reviewed: Allergy & Precautions, NPO status , Patient's Chart, lab work & pertinent test results  Airway Mallampati: I  TM Distance: >3 FB Neck ROM: Full    Dental no notable dental hx. (+) Dental Advisory Given   Pulmonary neg pulmonary ROS, Current Smoker,    Pulmonary exam normal breath sounds clear to auscultation       Cardiovascular negative cardio ROS Normal cardiovascular exam Rhythm:Regular Rate:Normal     Neuro/Psych negative neurological ROS     GI/Hepatic negative GI ROS, Neg liver ROS,   Endo/Other  negative endocrine ROS  Renal/GU negative Renal ROS     Musculoskeletal negative musculoskeletal ROS (+)   Abdominal   Peds  Hematology negative hematology ROS (+)   Anesthesia Other Findings   Reproductive/Obstetrics                            Anesthesia Physical Anesthesia Plan  ASA: II  Anesthesia Plan: General   Post-op Pain Management:    Induction: Intravenous  PONV Risk Score and Plan: 2 and Ondansetron, Dexamethasone, Midazolam and Treatment may vary due to age or medical condition  Airway Management Planned: Double Lumen EBT  Additional Equipment:   Intra-op Plan:   Post-operative Plan: Extubation in OR  Informed Consent: I have reviewed the patients History and Physical, chart, labs and discussed the procedure including the risks, benefits and alternatives for the proposed anesthesia with the patient or authorized representative who has indicated his/her understanding and acceptance.     Dental advisory given  Plan Discussed with: CRNA  Anesthesia Plan Comments: (2 x PIV)       Anesthesia Quick Evaluation

## 2020-12-03 NOTE — Interval H&P Note (Signed)
History and Physical Interval Note:  12/03/2020 1:38 PM  Victor Jefferson  has presented today for surgery, with the diagnosis of RIGHT PTX.  The various methods of treatment have been discussed with the patient and family. After consideration of risks, benefits and other options for treatment, the patient has consented to  Procedure(s) with comments: XI ROBOTIC ASSISTED THORASCOPY (Right) - WITH PLEURAL ABRASION STAPLING OF BLEBS (Right) as a surgical intervention.  The patient's history has been reviewed, patient examined, no change in status, stable for surgery.  I have reviewed the patient's chart and labs.  Questions were answered to the patient's satisfaction.     Loreli Slot

## 2020-12-03 NOTE — Transfer of Care (Signed)
Immediate Anesthesia Transfer of Care Note  Patient: Victor Jefferson  Procedure(s) Performed: XI ROBOTIC ASSISTED THORASCOPY (Right ) STAPLING OF BLEBS (Right ) INTERCOSTAL NERVE BLOCK (Right Chest)  Patient Location: PACU  Anesthesia Type:General  Level of Consciousness: awake, alert  and oriented  Airway & Oxygen Therapy: Patient Spontanous Breathing and Patient connected to face mask oxygen  Post-op Assessment: Report given to RN and Post -op Vital signs reviewed and stable  Post vital signs: Reviewed and stable  Last Vitals:  Vitals Value Taken Time  BP 128/102 12/03/20 1801  Temp    Pulse 69 12/03/20 1803  Resp 21 12/03/20 1803  SpO2 100 % 12/03/20 1803  Vitals shown include unvalidated device data.  Last Pain:  Vitals:   12/03/20 1337  TempSrc: Oral  PainSc: 2          Complications: No complications documented.

## 2020-12-03 NOTE — Brief Op Note (Addendum)
12/03/2020  5:40 PM  PATIENT:  Victor Jefferson  45 y.o. male  PRE-OPERATIVE DIAGNOSIS:  RECURRENT RIGHT PNEUMOTHORAX  POST-OPERATIVE DIAGNOSIS:  RECURRENT RIGHT PNEUMOTHORAX  PROCEDURE:  Procedure(s) with comments: XI ROBOTIC ASSISTED THORASCOPY (Right) - WITH PLEURAL ABRASION STAPLING OF BLEBS (Right) INTERCOSTAL NERVE BLOCK (Right)  SURGEON:  Surgeon(s) and Role:    * Loreli Slot, MD - Primary  PHYSICIAN ASSISTANT:  Jari Favre, PA-C   ANESTHESIA:   general  EBL:  25 mL   BLOOD ADMINISTERED:none  DRAINS: ONE PIGTAIL CATHETER AND ONE BLAKE DRAIN  LOCAL MEDICATIONS USED:  BUPIVICAINE   SPECIMEN:  Source of Specimen:  MULTIPLE RIGHT, MIDDLE AND LOWER LOBE BLEBS. PLEURA  DISPOSITION OF SPECIMEN:  PATHOLOGY  COUNTS:  YES  DICTATION: .Dragon Dictation  PLAN OF CARE: Admit to inpatient   PATIENT DISPOSITION:  PACU - hemodynamically stable.   Delay start of Pharmacological VTE agent (>24hrs) due to surgical blood loss or risk of bleeding: no

## 2020-12-04 ENCOUNTER — Inpatient Hospital Stay (HOSPITAL_COMMUNITY): Payer: BLUE CROSS/BLUE SHIELD

## 2020-12-04 ENCOUNTER — Encounter (HOSPITAL_COMMUNITY): Payer: Self-pay | Admitting: Thoracic Surgery (Cardiothoracic Vascular Surgery)

## 2020-12-04 LAB — CBC
HCT: 39.8 % (ref 39.0–52.0)
HCT: 26.9 % — ABNORMAL LOW (ref 39.0–52.0)
Hemoglobin: 13.2 g/dL (ref 13.0–17.0)
Hemoglobin: 8.7 g/dL — ABNORMAL LOW (ref 13.0–17.0)
MCH: 30.8 pg (ref 26.0–34.0)
MCH: 30.2 pg (ref 26.0–34.0)
MCHC: 33.2 g/dL (ref 30.0–36.0)
MCHC: 32.3 g/dL (ref 30.0–36.0)
MCV: 92.8 fL (ref 80.0–100.0)
MCV: 93.4 fL (ref 80.0–100.0)
Platelets: 290 10*3/uL (ref 150–400)
Platelets: 249 10*3/uL (ref 150–400)
RBC: 4.29 MIL/uL (ref 4.22–5.81)
RBC: 2.88 MIL/uL — ABNORMAL LOW (ref 4.22–5.81)
RDW: 13.1 % (ref 11.5–15.5)
RDW: 13.1 % (ref 11.5–15.5)
WBC: 11.8 10*3/uL — ABNORMAL HIGH (ref 4.0–10.5)
WBC: 9.2 10*3/uL (ref 4.0–10.5)
nRBC: 0 % (ref 0.0–0.2)
nRBC: 0 % (ref 0.0–0.2)

## 2020-12-04 LAB — BASIC METABOLIC PANEL
Anion gap: 8 (ref 5–15)
BUN: 10 mg/dL (ref 6–20)
CO2: 26 mmol/L (ref 22–32)
Calcium: 8.8 mg/dL — ABNORMAL LOW (ref 8.9–10.3)
Chloride: 99 mmol/L (ref 98–111)
Creatinine, Ser: 0.89 mg/dL (ref 0.61–1.24)
GFR, Estimated: >60 mL/min (ref >60)
Glucose, Bld: 125 mg/dL — ABNORMAL HIGH (ref 70–99)
Potassium: 4.3 mmol/L (ref 3.5–5.1)
Sodium: 133 mmol/L — ABNORMAL LOW (ref 135–145)

## 2020-12-04 LAB — GLUCOSE, CAPILLARY
Glucose-Capillary: 122 mg/dL — ABNORMAL HIGH (ref 70–99)
Glucose-Capillary: 106 mg/dL — ABNORMAL HIGH (ref 70–99)
Glucose-Capillary: 101 mg/dL — ABNORMAL HIGH (ref 70–99)

## 2020-12-04 MED ORDER — OXYCODONE HCL 5 MG PO TABS
5.0000 mg | ORAL_TABLET | ORAL | Status: DC | PRN
  Administered 2020-12-05: 5 mg via ORAL
  Administered 2020-12-06 – 2020-12-11 (×13): 10 mg via ORAL
  Filled 2020-12-04 (×11): qty 2
  Filled 2020-12-04: qty 1
  Filled 2020-12-04 (×2): qty 2

## 2020-12-04 MED ORDER — CHLORHEXIDINE GLUCONATE CLOTH 2 % EX PADS
6.0000 | MEDICATED_PAD | Freq: Every day | CUTANEOUS | Status: DC
  Administered 2020-12-05 – 2020-12-10 (×5): 6 via TOPICAL

## 2020-12-04 MED ORDER — ENOXAPARIN SODIUM 40 MG/0.4ML IJ SOSY: 40.0000 mg | PREFILLED_SYRINGE | Freq: Every day | INTRAMUSCULAR | Status: DC

## 2020-12-04 NOTE — Progress Notes (Addendum)
Nurse called to inform me that patient's just had a lot of bloody chest tube output. Pleura VAC was marked at 400 cc and is now up to 1280. She placed patient's chest tube to water seal. I confirmed the above. Patient's heart rate is in the 80's and SBP in the 110's. Patient has some incisional, chest tube pain but otherwise feels fine. As discussed with Dr. Dorris Fetch, will obtain CBC and repeat CXR. I placed chest tube back to suction as do not want chest tubes to clot. We tried to transfer to [facility], but because of lack of bed availability, will transfer to [facility] for closer monitoring.

## 2020-12-04 NOTE — Progress Notes (Signed)
Approx 1545, noted CT tubing full of dark red blood.  Drained tube into sahara and noted tube filling up again and again.  Vital signs taken.   Patient denies SOB, pain, dizziness.  Patient denies coughing.or any activities this far. 15:00 Sahara marked at 800 cc.   15:45 - 1350 cc Temporarily taking of suction and notified PA of output. PA bedside visit.  New orders : CXR and CBC.   Vital sign monitoring continues. RR notified.  Eugenio Hoes, RN

## 2020-12-04 NOTE — Progress Notes (Addendum)
      [address]       [name] [address]             [phone]        1 Day Post-Op Procedure(s) (LRB): XI ROBOTIC ASSISTED THORASCOPY (Right) STAPLING OF BLEBS (Right) INTERCOSTAL NERVE BLOCK (Right)  Subjective:  Does not have any pain this morning, feels good.   Objective: Vital signs in last 24 hours: Temp:  [97.4 F (36.3 C)-98.6 F (37 C)] 97.5 F (36.4 C) (06/09 0401) Pulse Rate:  [55-73] 66 (06/09 0401) Cardiac Rhythm: Sinus bradycardia (06/08 1909) Resp:  [16-20] 18 (06/09 0401) BP: (99-146)/(62-99) 99/62 (06/09 0401) SpO2:  [96 %-99 %] 96 % (06/09 0401)     Intake/Output from previous day: 06/08 0701 - 06/09 0700 In: 1300 [I.V.:1100; IV Piggyback:200] Out: 1065 [Urine:650; Blood:25; Chest Tube:390] Intake/Output this shift: No intake/output data recorded.  General appearance: alert, cooperative, and no distress Heart: regular rate and rhythm, S1, S2 normal, no murmur, click, rub or gallop Lungs: clear to auscultation bilaterally Extremities: extremities normal, atraumatic, no cyanosis or edema Wound: Healing well  Lab Results: Recent Labs    12/02/20 1904 12/04/20 0146  WBC 7.3 11.8*  HGB 14.6 13.2  HCT 43.8 39.8  PLT 324 290   BMET:  Recent Labs    12/02/20 1904 12/04/20 0146  NA 137 133*  K 3.9 4.3  CL 100 99  CO2 28 26  GLUCOSE 133* 125*  BUN 15 10  CREATININE 1.07 0.89  CALCIUM 8.6* 8.8*    PT/INR:  Recent Labs    12/02/20 1904  LABPROT 13.0  INR 1.0   ABG No results found for: PHART, HCO3, TCO2, ACIDBASEDEF, O2SAT CBG (last 3)  Recent Labs    12/03/20 2147 12/04/20 0518  GLUCAP 107* 122*    Assessment/Plan: S/P Procedure(s) (LRB): XI ROBOTIC ASSISTED THORASCOPY (Right) STAPLING OF BLEBS (Right) INTERCOSTAL NERVE BLOCK (Right)  CXR stable with pigtail catheter and blake drain in place. Small air leak with cough.  NSR in the 60s, BP low normal Creatinine 0.89, electrolytes okay Slight  hyponatremia Endo-blood glucose well controlled  Plan: Hopeful for water seal today. He is encouraged to use his incentive spirometer and ambulate around the unit.   LOS: 3 days    Sharlene Dory 12/04/2020 Patient seen and examined, agree with above Has a minimal air leak RUL limited reexpansion due to numerous blebs resected. Has some fluid at apex- likely will help with decreasing chance of recurrence Will keep tubes on suction today, water seal to ambulate, then go to water seal tomorrow if everything looks oK  Viviann Spare C. Dorris Fetch, MD [facility] [phone]

## 2020-12-04 NOTE — Op Note (Signed)
NAMEJaymes Jefferson, William MEDICAL RECORD NO: [id number] ACCOUNT NO: [id number] DATE OF BIRTH: 25-Mar-1976 FACILITY: [facility] LOCATION: [facility] PHYSICIAN: Salvatore Decent. 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  PROCEDURE:  Robotic right VATS, lysis of adhesions, resection of multiple blebs, apical pleurectomy and pleural abrasion and intercostal nerve blocks at levels 3 through 10.  SURGEON:  Salvatore Decent. Dorris Fetch, MD  ASSISTANT:  Jari Favre, PA.  ANESTHESIA:  General.  FINDINGS:  Extensive blebs occupying the majority of the surface area of the right upper lobe.  Also, extensive blebs on the middle lobe.  Smaller basilar blebs on the lower lobe. Adhesions along the right mammary and near the phrenic nerve and  anteriorly, but no adhesions posteriorly.  CLINICAL NOTE: Victor Jefferson is a 45 year old man with a history of multiple right spontaneous pneumothoraces.  Victor Jefferson had a right VATS and bleb resection in 2017.  Victor Jefferson had a recurrence in 10/2019, but refused surgery at that time.  Victor Jefferson now presents with  another recurrent right pneumothorax.  Victor Jefferson was again offered the option of conservative management with a tube versus bleb resection given the extensive bleb disease.  It was recommended Victor Jefferson proceed with surgery because of his high risk of recurrence.  The  indications, risks, benefits, and alternatives were discussed in detail with the patient.  Victor Jefferson understood and accepted the risks and agreed to proceed.  OPERATIVE NOTE: Victor Jefferson was brought to the operating room on 12/03/2020.  Victor Jefferson had induction of general anesthesia and was intubated with a double lumen endotracheal tube.  Intravenous antibiotics were administered.  Sequential compression devices  were placed on the calves for DVT prophylaxis.  Victor Jefferson  was placed in a left lateral decubitus position.  Single lung ventilation of the left lung was initiated and was tolerated well throughout the procedure.  The pigtail catheter in the right chest was  removed and the right chest was prepped and draped in the usual sterile fashion.  A timeout was performed.  A solution containing 20 mL of liposomal bupivacaine, 30 mL of 0.5% bupivacaine and 50 mL of saline was prepared.  This was used for local at the incision sites as well as for the intercostal nerve blocks. An incision was made  in the eighth interspace in the midaxillary line.  An 8 mm port was inserted.  The thoracoscope was advanced into the chest.  There was good isolation of the right lung.  A 12 mm port was placed in the eighth interspace 5 cm anterior to the camera port and  a 12 mm AirSeal port was placed in the tenth interspace centered between the two anterior ports.  Intercostal nerve blocks then were performed inserting a needle from a posterior approach and injecting 10 mL of the bupivacaine solution into a subpleural  plane each level from the third to the tenth interspace. An 8 mm port was then placed in the eighth interspace posterior to the camera port.  The robotic instruments were inserted.  Inspection revealed some adhesions anteriorly, but essentially no adhesions apically or posterolaterally. Blebs were present anterior to the adhesions.  So, the adhesions were taken down with bipolar cautery.  There was some minor bleeding with that.  In  the area of the previous incision, the adhesions were more dense and this was involving the middle lobe.  The area  where the adhesions were taken down from the chest wall was ultimately stapled. After freeing up the middle lobe, there were significant  adhesions in the middle lobe posterolaterally.  After freeing up the middle lobe, there were extensive blebs anteriorly and posteriorly.  The blebs were resected using a robotic stapler, either blue  or green cartridges were used depending on the  thickness of the tissue and areas where there was relatively thin lung.  [employer] End[employer] staple line reinforcement was used.  All the stapled specimens were sent for permanent pathology.  Next, attention was turned to the upper lobe and adhesions were taken off the chest  wall anteriorly and superiorly.  The internal mammary artery and vein were identified.  The pleura came off with the lung in that area.  The phrenic nerve was identified and care was taken to preserve it.  The remainder of the adhesions were taken down with  bipolar cautery.  The upper lobe had extensive bleb disease in multiple areas and nearly the entire apical portion of the upper lobe consisted of blebs.  Multiple separate areas of blebs and confluent areas of blebs were resected using the robotic  stapler.  Next the lower lobe was inspected.  There were no blebs in the superior segment.  There was a small grouping of blebs inferiorly, and then there was an area of thinned out lung or bleb involving part of the basilar which that area was  resected with a wedge resection using the staplers.  The other blebs were removed as well.  The chest was copiously irrigated with warm saline.  Next, attention was turned to the pleura. The apical pleura was stripped down to the third rib.  There was  some minor bleeding with this, but nothing significant.  The remainder of the pleural surface was lightly abraded using a sponge.  The chest then was copiously irrigated with warm saline.  A test inflation revealed some air leakage from a small bleb  adjacent to a staple line inferiorly on the right upper lobe.  This area was re-stapled and there was no air leak after that. The sponges that had been placed were removed.  The robotic instruments were removed.  The robot was undocked.  A 28-French  Blake drain was placed through the anterior eighth interspace incision and directed to the apex.  A pigtail  catheter was placed separately using a modified Seldinger technique and also directed towards the apex.  Dual-lung ventilation was resumed.   There was good reexpansion of the right lung.  The chest tubes were placed to a Pleur-evac on suction.  The patient was placed back in supine position.  Victor Jefferson was extubated in the operating room and taken to the Postanesthetic Care Unit in good condition.      PAA D: 12/03/2020 6:06:46 pm T: 12/04/2020 2:10:00 am  JOB: [id number]/ [id number]

## 2020-12-04 NOTE — Progress Notes (Signed)
      [address]       [place] [address]             [phone]      Called to see patient for high CT output Uneventful day until around 3:45 when he was noted to have a large amount of dark bloody fluid draining from chest tube. Doree Fudge checked on him and his vitals were stable, no distress.  He ended up putting out over 500 ml in less than an hour.  On exam currently in SR at 85, BP 115/68  Dark bloody fluid in tubing  No distress and BS clear bilaterally  CXR this AM showed a loculated hemothorax superiorly, repeat official reading is no significant change, although I think there has been some decrease in the opacity superiorly  Hgb 8.7 (down from 13)  Will move to ICU for close monitoring- if bleeding persists may have to return to OR  [place] C. Dorris Fetch, MD [facility] [phone]

## 2020-12-04 NOTE — Anesthesia Postprocedure Evaluation (Signed)
Anesthesia Post Note  Patient: Victor Jefferson  Procedure(s) Performed: XI ROBOTIC ASSISTED THORASCOPY (Right) STAPLING OF BLEBS (Right) INTERCOSTAL NERVE BLOCK (Right: Chest)     Patient location during evaluation: PACU Anesthesia Type: General Level of consciousness: sedated and patient cooperative Pain management: pain level controlled Vital Signs Assessment: post-procedure vital signs reviewed and stable Respiratory status: spontaneous breathing Cardiovascular status: stable Anesthetic complications: no   No notable events documented.  Last Vitals:  Vitals:   12/04/20 0401 12/04/20 0830  BP: 99/62 103/64  Pulse: 66 67  Resp: 18   Temp: (!) 36.4 C 36.7 C  SpO2: 96%     Last Pain:  Vitals:   12/04/20 0830  TempSrc: Oral  PainSc: 0-No pain                 Lewie Loron

## 2020-12-04 NOTE — Progress Notes (Signed)
Report called in to 2H 16

## 2020-12-05 ENCOUNTER — Inpatient Hospital Stay (HOSPITAL_COMMUNITY): Payer: BLUE CROSS/BLUE SHIELD

## 2020-12-05 ENCOUNTER — Encounter (HOSPITAL_COMMUNITY): Payer: Self-pay | Admitting: *Deleted

## 2020-12-05 LAB — COMPREHENSIVE METABOLIC PANEL
ALT: 15 U/L (ref 0–44)
AST: 28 U/L (ref 15–41)
Albumin: 2.6 g/dL — ABNORMAL LOW (ref 3.5–5.0)
Alkaline Phosphatase: 33 U/L — ABNORMAL LOW (ref 38–126)
Anion gap: 6 (ref 5–15)
BUN: 11 mg/dL (ref 6–20)
CO2: 27 mmol/L (ref 22–32)
Calcium: 7.9 mg/dL — ABNORMAL LOW (ref 8.9–10.3)
Chloride: 102 mmol/L (ref 98–111)
Creatinine, Ser: 0.82 mg/dL (ref 0.61–1.24)
GFR, Estimated: >60 mL/min (ref >60)
Glucose, Bld: 99 mg/dL (ref 70–99)
Potassium: 3.6 mmol/L (ref 3.5–5.1)
Sodium: 135 mmol/L (ref 135–145)
Total Bilirubin: 0.8 mg/dL (ref 0.3–1.2)
Total Protein: 4.9 g/dL — ABNORMAL LOW (ref 6.5–8.1)

## 2020-12-05 LAB — CBC
HCT: 23.6 % — ABNORMAL LOW (ref 39.0–52.0)
Hemoglobin: 7.7 g/dL — ABNORMAL LOW (ref 13.0–17.0)
MCH: 30.6 pg (ref 26.0–34.0)
MCHC: 32.6 g/dL (ref 30.0–36.0)
MCV: 93.7 fL (ref 80.0–100.0)
Platelets: 217 10*3/uL (ref 150–400)
RBC: 2.52 MIL/uL — ABNORMAL LOW (ref 4.22–5.81)
RDW: 13 % (ref 11.5–15.5)
WBC: 8.3 10*3/uL (ref 4.0–10.5)
nRBC: 0 % (ref 0.0–0.2)

## 2020-12-05 LAB — SURGICAL PATHOLOGY

## 2020-12-05 LAB — GLUCOSE, CAPILLARY: Glucose-Capillary: 102 mg/dL — ABNORMAL HIGH (ref 70–99)

## 2020-12-05 NOTE — Discharge Summary (Addendum)
Physician Discharge Summary  Patient ID: Victor Jefferson MRN: [id number] DOB/AGE: 09-22-1975 45 y.o.  Admit date: 12/01/2020 Discharge date: 12/11/2020  Admission Diagnoses:  Patient Active Problem List   Diagnosis Date Noted   Spontaneous pneumothorax 12/01/2020   Recurrent spontaneous pneumothorax 10/31/2019   Pneumothorax on right 08/10/2015     Discharge Diagnoses:  Active Problems:   Spontaneous pneumothorax   S/P robot-assisted surgical procedure   Discharged Condition: good  HPI:   Victor Jefferson is a 45 year old male with a past history of tobacco abuse, hepatitis B,  and recurrent right spontaneous pneumothoraces x3.  He underwent a right VATS with blebectomy and pleural abrasion in 2017 after his second spontaneous pneumothorax.  He did well until 10/31/2019 when he presented with a 4-day history of shortness of breath and chest discomfort.  He was found to have recurrent pneumothorax.  Chest tube was placed.  The right lung expanded.  Re do surgery for blebectomy was recommended at that time but he declined.  After the airleak resolved, the chest tube was removed and he was discharged home on 11/04/2019.   Victor Jefferson presented to the [facility] urgent care center last evening with complaint of shortness of breath associated with chest discomfort and dizziness.  Chest x-ray demonstrated a right pneumothorax.  He was then transferred to [facility], ED for further evaluation.  After arrival by EMS, a right pleural pigtail catheter was placed by the ED physician.  This resulted in resolution of the lower lung zone pneumothorax but he continued to have small residual apical pneumothorax.  CT surgery was consulted.  Patient has been admitted to our service but remains in the ED awaiting room placement. 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  Hospital Course:   On 12/03/2020 Victor Jefferson underwent a  robotic right VATS, lysis of adhesions, resection of multiple blebs, apical pleurectomy and pleural abrasion with Dr. Dorris Fetch.  He tolerated the procedure well and was transferred to the cardiac telemetry unit in stable condition.  Postop day 1 he had a pigtail catheter and Blake drain in place with a small air leak.  He was hemodynamically stable and normal sinus rhythm.  We decided to keep the tubes on suction and ambulate on waterseal.  Later that afternoon there was an increase in his chest tube drainage.  Due to this increase we ordered a stat CBC and a repeat chest x-ray.  His hemoglobin was 13 immediately postop but had dropped to 8.7.  He was transferred to heart for closer monitoring.  Repeat chest x-ray was stable with areas of atelectasis and a small stable right apical pneumothorax.  Postop day 2 there was a marked decrease in chest tube output signifying that the bleeding had slowed or stopped.  Chest x-ray showed some decrease in apical fluid and blood.  We stopped Lovenox the previous day and continue to hold it.  The patient was stable for transfer to [facility] for continued care. His pigtail catheter was pulled on 6/11 without issue. His blake drain was left in place. We encouraged ambulation frequently since his Lovenox was discontinued. POD 4 he continued to slowly progress. He had limited output from the chest tube.  There was a slight increase in pneumothorax with the chest tube pull. He did continue to have an air leak. POD 5 he was placed back to suction to help the lung expand and remain  expanded. We continued to try and stay ahead of his pain. Appetite was improving and although his BP was borderline he didn't have any symptoms with ambulation. On POD 6 we placed his chest tube back to water seal and encouraged use of incentive spirometer. On 6/15 he was switched to a mini express and today he is ready for discharge. He is ambulating with limited assistance, tolerated room air, his incisions  are healing well, and he is going home with his father at the house. Home health nursing has been ordered.   Consults: None  Significant Diagnostic Studies:   CLINICAL DATA:  Pneumothorax.   EXAM: PORTABLE CHEST 1 VIEW   COMPARISON:  December 04, 2020.   FINDINGS: The heart size and mediastinal contours are within normal limits. Left lung is clear. Two right-sided chest tubes are again noted and unchanged in position. Stable mild right pneumothorax is noted. Stable right lung opacities are noted. The visualized skeletal structures are unremarkable.   IMPRESSION: Stable position of 2 right-sided chest tubes with stable right pneumothorax and right lung opacities.     Electronically Signed   By: Lupita Raider M.D.   On: 12/05/2020 08:05  Treatments:   NAME: Jefferson, Victor MEDICAL RECORD NO: [id number] ACCOUNT NO: [id number] DATE OF BIRTH: 1975-08-11 FACILITY: [facility] LOCATION: [facility] PHYSICIAN: Salvatore Decent. 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   PROCEDURE:  Robotic right VATS, lysis of adhesions, resection of multiple blebs, apical pleurectomy and pleural abrasion and intercostal nerve blocks at levels 3 through 10.   SURGEON:  Salvatore Decent. Dorris Fetch, MD   ASSISTANT:  Jari Favre,  PA.   ANESTHESIA:  General.   FINDINGS:  Extensive blebs occupying the majority of the surface area of the right upper lobe.  Also, extensive blebs on the middle lobe.  Smaller basilar blebs on the lower lobe. Adhesions along the right mammary and near the phrenic nerve and anteriorly, but no adhesions posteriorly.   Discharge Exam: Blood pressure 99/65, pulse 81, temperature 98.3 F (36.8 C), temperature source Oral, resp. rate 12, height 5\' 11"  (1.803 m), weight 61.2 kg,  SpO2 99 %.   General appearance: alert, cooperative, and no distress Heart: regular rate and rhythm, S1, S2 normal, no murmur, click, rub or gallop Lungs: clear to auscultation bilaterally Abdomen: soft, non-tender; bowel sounds normal; no masses,  no organomegaly Extremities: extremities normal, atraumatic, no cyanosis or edema Wound: clean and dry  Disposition: Discharge disposition: 01-Home or Self Care       Allergies as of 12/11/2020       Reactions   Charentais Melon (french Melon) Itching, Rash   Allergic to raw melons Allergic to raw melons   Latex Other (See Comments)   Burning ,itchy         Medication List     STOP taking these medications    ondansetron 4 MG tablet Commonly known as: ZOFRAN       TAKE these medications    acetaminophen 325 MG tablet Commonly known as: TYLENOL Take 650 mg by mouth every 6 (six) hours as needed.   oxyCODONE 5 MG immediate release tablet Commonly known as: Oxy IR/ROXICODONE Take 1 tablet (5 mg total) by mouth every 6 (six) hours as needed for moderate pain or breakthrough pain.  Follow-up Information     Loreli Slot, MD Follow up.   Specialty: Cardiothoracic Surgery Why: Your follow-up appointment is on 6/21 at 10:30am. PLease arrive at 10am for a chest xray located at [facility] which is on the first floor of our building Contact information: [address] [place] [address] [phone]                 Signed: Sharlene Dory 12/11/2020, 8:47 AM

## 2020-12-05 NOTE — Progress Notes (Signed)
2 Days Post-Op Procedure(s) (LRB): XI ROBOTIC ASSISTED THORASCOPY (Right) STAPLING OF BLEBS (Right) INTERCOSTAL NERVE BLOCK (Right) Subjective: Some right shoulder pain and headache  Objective: Vital signs in last 24 hours: Temp:  [97.8 F (36.6 C)-98.7 F (37.1 C)] 97.8 F (36.6 C) (06/10 0724) Pulse Rate:  [63-98] 67 (06/10 0700) Cardiac Rhythm: Normal sinus rhythm (06/10 0400) Resp:  [14-34] 16 (06/10 0700) BP: (98-126)/(55-77) 107/61 (06/10 0700) SpO2:  [92 %-97 %] 95 % (06/10 0700)  Hemodynamic parameters for last 24 hours:    Intake/Output from previous day: 06/09 0701 - 06/10 0700 In: 40 [P.O.:40] Out: 2470 [Urine:750; Chest Tube:1720] Intake/Output this shift: No intake/output data recorded.  General appearance: alert, cooperative, and no distress Neurologic: intact Heart: regular rate and rhythm Lungs: clear to auscultation bilaterally No air leak  Lab Results: Recent Labs    12/04/20 1634 12/05/20 0432  WBC 9.2 8.3  HGB 8.7* 7.7*  HCT 26.9* 23.6*  PLT 249 217   BMET:  Recent Labs    12/04/20 0146 12/05/20 0432  NA 133* 135  K 4.3 3.6  CL 99 102  CO2 26 27  GLUCOSE 125* 99  BUN 10 11  CREATININE 0.89 0.82  CALCIUM 8.8* 7.9*    PT/INR:  Recent Labs    12/02/20 1904  LABPROT 13.0  INR 1.0   ABG No results found for: PHART, HCO3, TCO2, ACIDBASEDEF, O2SAT CBG (last 3)  Recent Labs    12/04/20 0518 12/04/20 1622 12/04/20 2131  GLUCAP 122* 106* 101*    Assessment/Plan: S/P Procedure(s) (LRB): XI ROBOTIC ASSISTED THORASCOPY (Right) STAPLING OF BLEBS (Right) INTERCOSTAL NERVE BLOCK (Right) Plan for transfer to step-down: see transfer orders POD # 2 Extensive bleb resection, apical pleurectomy, pleural abrasion Bleeding yesterday necessitating transfer to ICU for monitoring Bleeding has slowed/ stopped with marked decrease in CT output CXR shows some decrease in apical fluid/ blood Anemia secondary to ABL- Hgb 7.7 Enoxaparin  stopped Continue to monitor closely but I think he can go to 2C if bed available Ambulate  LOS: 4 days    Loreli Slot 12/05/2020

## 2020-12-05 NOTE — Discharge Instructions (Signed)
Discharge Instructions:  1. You may shower, please wash incisions daily with soap and water and keep dry.  If you wish to cover wounds with dressing you may do so but please keep clean and change daily.  No tub baths or swimming until incisions have completely healed.  If your incisions become red or develop any drainage please call our office at [phone]  2. No Driving until cleared by Dr. Dorris Fetch office and you are no longer using narcotic pain medications  3. Monitor your weight daily.. Please use the same scale and weigh at same time... If you gain 3-5 lbs in 48 hours with associated lower extremity swelling, please contact our office at [phone]  4. Fever of 101.5 for at least 24 hours with no source, please contact our office at [phone]  5. Activity- up as tolerated, please walk at least 3 times per day.  Avoid strenuous activity, no lifting, pushing, or pulling with your arms over 8-10 lbs for a minimum of 6 weeks  6. If any questions or concerns arise, please do not hesitate to contact our office at [phone]

## 2020-12-06 ENCOUNTER — Inpatient Hospital Stay (HOSPITAL_COMMUNITY): Payer: BLUE CROSS/BLUE SHIELD

## 2020-12-06 LAB — CBC
HCT: 23.7 % — ABNORMAL LOW (ref 39.0–52.0)
Hemoglobin: 7.9 g/dL — ABNORMAL LOW (ref 13.0–17.0)
MCH: 31.3 pg (ref 26.0–34.0)
MCHC: 33.3 g/dL (ref 30.0–36.0)
MCV: 94 fL (ref 80.0–100.0)
Platelets: 231 10*3/uL (ref 150–400)
RBC: 2.52 MIL/uL — ABNORMAL LOW (ref 4.22–5.81)
RDW: 13.1 % (ref 11.5–15.5)
WBC: 9.4 10*3/uL (ref 4.0–10.5)
nRBC: 0 % (ref 0.0–0.2)

## 2020-12-06 NOTE — Progress Notes (Signed)
3 Days Post-Op Procedure(s) (LRB): XI ROBOTIC ASSISTED THORASCOPY (Right) STAPLING OF BLEBS (Right) INTERCOSTAL NERVE BLOCK (Right) Subjective: No complaints this AM Some abdominal discomfort yesterday  Objective: Vital signs in last 24 hours: Temp:  [98 F (36.7 C)-98.8 F (37.1 C)] 98 F (36.7 C) (06/11 0804) Pulse Rate:  [71-83] 83 (06/11 0804) Cardiac Rhythm: Normal sinus rhythm (06/11 0701) Resp:  [16-23] 16 (06/11 0804) BP: (96-110)/(55-65) 96/65 (06/11 0804) SpO2:  [96 %-98 %] 98 % (06/11 0804)  Hemodynamic parameters for last 24 hours:    Intake/Output from previous day: 06/10 0701 - 06/11 0700 In: 485 [P.O.:485] Out: 1195 [Urine:925; Chest Tube:270] Intake/Output this shift: Total I/O In: 240 [P.O.:240] Out: 310 [Urine:300; Chest Tube:10]  General appearance: alert, cooperative, and no distress Neurologic: intact Heart: regular rate and rhythm Lungs: clear to auscultation bilaterally No air leak  Lab Results: Recent Labs    12/05/20 0432 12/06/20 0020  WBC 8.3 9.4  HGB 7.7* 7.9*  HCT 23.6* 23.7*  PLT 217 231   BMET:  Recent Labs    12/04/20 0146 12/05/20 0432  NA 133* 135  K 4.3 3.6  CL 99 102  CO2 26 27  GLUCOSE 125* 99  BUN 10 11  CREATININE 0.89 0.82  CALCIUM 8.8* 7.9*    PT/INR: No results for input(s): LABPROT, INR in the last 72 hours. ABG No results found for: PHART, HCO3, TCO2, ACIDBASEDEF, O2SAT CBG (last 3)  Recent Labs    12/04/20 1622 12/04/20 2131 12/05/20 0721  GLUCAP 106* 101* 102*    Assessment/Plan: S/P Procedure(s) (LRB): XI ROBOTIC ASSISTED THORASCOPY (Right) STAPLING OF BLEBS (Right) INTERCOSTAL NERVE BLOCK (Right) No evidence of further bleeding No air leak- will dc pigtail Leave Blake drain in on water seal, hopefully can dc tomorrow Hgb stable Continue SCDs and ambulation as enoxaparin discontinued Pain well controlled  LOS: 5 days    Loreli Slot 12/06/2020

## 2020-12-07 ENCOUNTER — Inpatient Hospital Stay (HOSPITAL_COMMUNITY): Payer: BLUE CROSS/BLUE SHIELD

## 2020-12-07 LAB — BASIC METABOLIC PANEL
Anion gap: 6 (ref 5–15)
BUN: 13 mg/dL (ref 6–20)
CO2: 29 mmol/L (ref 22–32)
Calcium: 8.4 mg/dL — ABNORMAL LOW (ref 8.9–10.3)
Chloride: 100 mmol/L (ref 98–111)
Creatinine, Ser: 0.75 mg/dL (ref 0.61–1.24)
GFR, Estimated: >60 mL/min (ref >60)
Glucose, Bld: 110 mg/dL — ABNORMAL HIGH (ref 70–99)
Potassium: 3.7 mmol/L (ref 3.5–5.1)
Sodium: 135 mmol/L (ref 135–145)

## 2020-12-07 MED ORDER — POTASSIUM CHLORIDE CRYS ER 20 MEQ PO TBCR
40.0000 meq | EXTENDED_RELEASE_TABLET | Freq: Once | ORAL | Status: AC
  Administered 2020-12-07: 40 meq via ORAL
  Filled 2020-12-07: qty 2

## 2020-12-07 NOTE — Progress Notes (Signed)
[address]       [place] [address]             [phone]      4 Days Post-Op Procedure(s) (LRB): XI ROBOTIC ASSISTED THORASCOPY (Right) STAPLING OF BLEBS (Right) INTERCOSTAL NERVE BLOCK (Right) Subjective: Feels ok, not significantly SOB  Objective: Vital signs in last 24 hours: Temp:  [98.1 F (36.7 C)-98.8 F (37.1 C)] 98.2 F (36.8 C) (06/12 0722) Pulse Rate:  [72-87] 72 (06/12 0722) Cardiac Rhythm: Normal sinus rhythm (06/11 2100) Resp:  [16-22] 18 (06/12 0722) BP: (94-106)/(55-72) 106/66 (06/12 0722) SpO2:  [97 %-99 %] 98 % (06/12 0722)  Hemodynamic parameters for last 24 hours:    Intake/Output from previous day: 06/11 0701 - 06/12 0700 In: 600 [P.O.:600] Out: 1240 [Urine:1100; Chest Tube:140] Intake/Output this shift: No intake/output data recorded.  General appearance: alert, cooperative, and no distress Heart: regular rate and rhythm Lungs: clear to auscultation bilaterally Abdomen: benign Extremities: no edema or calf tenderness Wound: incis healing well  Lab Results: Recent Labs    12/05/20 0432 12/06/20 0020  WBC 8.3 9.4  HGB 7.7* 7.9*  HCT 23.6* 23.7*  PLT 217 231   BMET:  Recent Labs    12/05/20 0432  NA 135  K 3.6  CL 102  CO2 27  GLUCOSE 99  BUN 11  CREATININE 0.82  CALCIUM 7.9*    PT/INR: No results for input(s): LABPROT, INR in the last 72 hours. ABG No results found for: PHART, HCO3, TCO2, ACIDBASEDEF, O2SAT CBG (last 3)  Recent Labs    12/04/20 1622 12/04/20 2131 12/05/20 0721  GLUCAP 106* 101* 102*    Meds Scheduled Meds:  acetaminophen  1,000 mg Oral Q6H   Or   acetaminophen (TYLENOL) oral liquid 160 mg/5 mL  1,000 mg Oral Q6H   bisacodyl  10 mg Oral Daily   Chlorhexidine Gluconate Cloth  6 each Topical Daily   ketorolac  15 mg Intravenous Q6H   senna-docusate  1 tablet Oral QHS   Continuous Infusions:  sodium chloride     PRN Meds:.fentaNYL (SUBLIMAZE) injection, ondansetron  (ZOFRAN) IV, oxyCODONE  Xrays DG Chest Port 1 View  Result Date: 12/07/2020 CLINICAL DATA:  Follow-up of spontaneous pneumothorax EXAM: PORTABLE CHEST 1 VIEW COMPARISON:  1 day prior FINDINGS: Midline trachea. Normal heart size. 1 of the 2 right-sided chest tubes has been removed. Slight increase in small to moderate circumferential right-sided pneumothorax. On the order of 10-15%. No pleural fluid. Right apical and lung base pulmonary opacities are not significantly changed. The left lung remains clear with underlying hyperinflation indicative of emphysema. IMPRESSION: Removal of 1 of 2 right-sided chest tubes with slight increase in 10-15% pneumothorax. Similar apical and basilar right-sided pulmonary opacities. Emphysema (ICD10-J43.9). Electronically Signed   By: Jeronimo Greaves M.D.   On: 12/07/2020 08:07   DG Chest Port 1 View  Result Date: 12/06/2020 CLINICAL DATA:  Chest pain, evaluate chest tubes, right pneumothorax, lung opacities EXAM: PORTABLE CHEST 1 VIEW COMPARISON:  12/05/2009 FINDINGS: Unchanged cardiomediastinal silhouette. There are 2 right apical chest tubes unchanged in position. Unchanged right-sided pneumothorax and upper lung airspace opacities. There is improvement in lower lung airspace opacities. Bones are unchanged. IMPRESSION: Unchanged right-sided pneumothorax and upper lung airspace opacities with chest tubes unchanged in position. Slight improvement in right lower lung airspace opacities. Electronically Signed   By: Caprice Renshaw   On: 12/06/2020 10:05    Assessment/Plan: S/P Procedure(s) (LRB):  XI ROBOTIC ASSISTED THORASCOPY (Right) STAPLING OF BLEBS (Right) INTERCOSTAL NERVE BLOCK (Right)  1 afeb, VSS 2 sats good on RA 3 CT 140 cc /24 h, + air leak, added vaseline gauze around the chest tube and reinforced the dressing, keep to as is 4 CXR - slight increase in pntx, pulm opacities stable in appearance 5 no new labs     LOS: 6 days    Rowe Clack PA-C Pager [phone]  [phone] 12/07/2020

## 2020-12-08 ENCOUNTER — Inpatient Hospital Stay (HOSPITAL_COMMUNITY): Payer: BLUE CROSS/BLUE SHIELD

## 2020-12-08 LAB — MAGNESIUM: Magnesium: 1.8 mg/dL (ref 1.7–2.4)

## 2020-12-08 NOTE — Plan of Care (Signed)

## 2020-12-08 NOTE — Plan of Care (Signed)
  Problem: Education: °Goal: Knowledge of General Education information will improve °Description: Including pain rating scale, medication(s)/side effects and non-pharmacologic comfort measures °Outcome: Progressing °  °Problem: Clinical Measurements: °Goal: Ability to maintain clinical measurements within normal limits will improve °Outcome: Progressing °Goal: Will remain free from infection °Outcome: Progressing °Goal: Cardiovascular complication will be avoided °Outcome: Progressing °  °Problem: Activity: °Goal: Risk for activity intolerance will decrease °Outcome: Progressing °  °Problem: Nutrition: °Goal: Adequate nutrition will be maintained °Outcome: Progressing °  °

## 2020-12-08 NOTE — Progress Notes (Addendum)
      [address]       [place] [address]             [phone]      5 Days Post-Op Procedure(s) (LRB): XI ROBOTIC ASSISTED THORASCOPY (Right) STAPLING OF BLEBS (Right) INTERCOSTAL NERVE BLOCK (Right) Subjective: Feels okay this morning, pain is a 7-8 around the chest tube.   Objective: Vital signs in last 24 hours: Temp:  [98.2 F (36.8 C)-98.5 F (36.9 C)] 98.5 F (36.9 C) (06/13 0716) Pulse Rate:  [72-91] 76 (06/13 0716) Cardiac Rhythm: Normal sinus rhythm (06/12 2000) Resp:  [15-18] 17 (06/13 0716) BP: (92-122)/(56-73) 92/56 (06/13 0300) SpO2:  [95 %-98 %] 98 % (06/13 0716)     Intake/Output from previous day: 06/12 0701 - 06/13 0700 In: 120 [P.O.:120] Out: 1640 [Urine:1550; Chest Tube:90] Intake/Output this shift: No intake/output data recorded.  General appearance: alert, cooperative, and no distress Heart: regular rate and rhythm, S1, S2 normal, no murmur, click, rub or gallop Lungs: clear to auscultation bilaterally Abdomen: soft, non-tender; bowel sounds normal; no masses,  no organomegaly Extremities: extremities normal, atraumatic, no cyanosis or edema Wound: clean and dry, chest tube site just re-dressed  Lab Results: Recent Labs    12/06/20 0020  WBC 9.4  HGB 7.9*  HCT 23.7*  PLT 231   BMET:  Recent Labs    12/07/20 1658  NA 135  K 3.7  CL 100  CO2 29  GLUCOSE 110*  BUN 13  CREATININE 0.75  CALCIUM 8.4*    PT/INR: No results for input(s): LABPROT, INR in the last 72 hours. ABG No results found for: PHART, HCO3, TCO2, ACIDBASEDEF, O2SAT CBG (last 3)  Recent Labs    12/05/20 0721  GLUCAP 102*    Assessment/Plan: S/P Procedure(s) (LRB): XI ROBOTIC ASSISTED THORASCOPY (Right) STAPLING OF BLEBS (Right) INTERCOSTAL NERVE BLOCK (Right)  Pulm- right upper lobe opacity is about the same. Continued pneumothorax. 90cc/24 hours out of chest tube CV- hypotension this morning but has been low-normal Renal-creatinine  0.75, electrolytes okay H and H 7.9/23.7, stable Endo-blood glucose has been stable, discontinued blood glucose checks  Plan:  He still has an air leak with cough and a fluid wave. He has been up walking in the halls without issue. Even though his BP has been borderline he has not been dizzy/lightheaded. Continue chest tube to water seal for now. Encouraged incentive spirometer and ambulation today. Appetite has been good.    LOS: 7 days    Sharlene Dory 12/08/2020 Patient seen and examined, agree with above Will place tube back to suction for 24 hours  Dionicia Cerritos C. Dorris Fetch, MD [facility] [phone]

## 2020-12-09 ENCOUNTER — Inpatient Hospital Stay (HOSPITAL_COMMUNITY): Payer: BLUE CROSS/BLUE SHIELD

## 2020-12-09 NOTE — Progress Notes (Signed)
Mobility Specialist: Progress Note   12/09/20 1549  Mobility  Activity Ambulated in hall  Level of Assistance Independent  Assistive Device None  Distance Ambulated (ft) 400 ft  Mobility Ambulated independently in hallway  Mobility Response Tolerated well  Mobility performed by Mobility specialist  $Mobility charge 1 Mobility   Pre-Mobility: 76 HR, 99% SpO2 During Mobility: 108 HR Post-Mobility: 93 HR, 100% SpO2  Pt asx during ambulation. Pt back to bed after walk with call bell at his side.   [facility] Victor Jefferson Mobility Specialist Mobility Specialist Phone: [phone]

## 2020-12-09 NOTE — Progress Notes (Signed)
       [address]       [place] [address]             [phone]       6 Days Post-Op Procedure(s) (LRB): XI ROBOTIC ASSISTED THORASCOPY (Right) STAPLING OF BLEBS (Right) INTERCOSTAL NERVE BLOCK (Right) Subjective: Feels okay this morning, did some walking around the room but not in the halls since being switched to suction. He is just having some discomfort around the chest tube site.  Objective: Vital signs in last 24 hours: Temp:  [98.3 F (36.8 C)-98.9 F (37.2 C)] 98.5 F (36.9 C) (06/14 0714) Pulse Rate:  [71-81] 71 (06/14 0714) Cardiac Rhythm: Normal sinus rhythm (06/13 1900) Resp:  [14-21] 14 (06/14 0714) BP: (94-104)/(48-66) 95/61 (06/14 0714) SpO2:  [80 %-100 %] 98 % (06/14 0714)     Intake/Output from previous day: 06/13 0701 - 06/14 0700 In: 400 [P.O.:400] Out: 1625 [Urine:1475; Chest Tube:150] Intake/Output this shift: No intake/output data recorded.  General appearance: alert, cooperative, and no distress Heart: regular rate and rhythm, S1, S2 normal, no murmur, click, rub or gallop Lungs: clear to auscultation bilaterally Extremities: extremities normal, atraumatic, no cyanosis or edema Wound: clean and dry  Lab Results: No results for input(s): WBC, HGB, HCT, PLT in the last 72 hours. BMET:  Recent Labs    12/07/20 1658  NA 135  K 3.7  CL 100  CO2 29  GLUCOSE 110*  BUN 13  CREATININE 0.75  CALCIUM 8.4*    PT/INR: No results for input(s): LABPROT, INR in the last 72 hours. ABG No results found for: PHART, HCO3, TCO2, ACIDBASEDEF, O2SAT CBG (last 3)  No results for input(s): GLUCAP in the last 72 hours.  Assessment/Plan: S/P Procedure(s) (LRB): XI ROBOTIC ASSISTED THORASCOPY (Right) STAPLING OF BLEBS (Right) INTERCOSTAL NERVE BLOCK (Right)  Pulm- CXR showed rigt upper lobe opacity and continued pneumo.  150cc/24 hours out of chest tube CV- hypotension this morning but has been low-normal Renal-creatinine 0.75,  electrolytes okay H and H 7.9/23.7, stable Endo-blood glucose has been stable, discontinued blood glucose checks  Plan: He is feeling pretty good this morning, did walk around the room yesterday but not in the halls since placed to suction. Right apical and basilar pneumothorax appears to be about the same.    LOS: 8 days    Sharlene Dory 12/09/2020

## 2020-12-10 ENCOUNTER — Inpatient Hospital Stay (HOSPITAL_COMMUNITY): Payer: BLUE CROSS/BLUE SHIELD

## 2020-12-10 NOTE — Plan of Care (Signed)

## 2020-12-10 NOTE — Plan of Care (Signed)

## 2020-12-10 NOTE — Progress Notes (Signed)
Mobility Specialist: Progress Note   12/10/20 1637  Mobility  Activity Ambulated in hall  Level of Assistance Independent  Assistive Device None  Distance Ambulated (ft) 550 ft  Mobility Ambulated independently in hallway  Mobility Response Tolerated well  Mobility performed by Mobility specialist  $Mobility charge 1 Mobility   Pre-Mobility: 88 HR Post-Mobility: 115 HR, 98% SpO2  Pt c/o feeling a little dizzy during ambulation but said it did not progress worse throughout, otherwise asx. Pt back to bed after walk with pt's mom present in the room and call bell at his side.  [facility] [place] Mobility Specialist Mobility Specialist Phone: [phone]

## 2020-12-10 NOTE — Progress Notes (Addendum)
      [address]       [place] [address]             [phone]      7 Days Post-Op Procedure(s) (LRB): XI ROBOTIC ASSISTED THORASCOPY (Right) STAPLING OF BLEBS (Right) INTERCOSTAL NERVE BLOCK (Right) Subjective: Feels pretty good this morning, pain is better around the tube. He did walk in the halls yesterday and he is feeling stronger each day.   Objective: Vital signs in last 24 hours: Temp:  [98 F (36.7 C)-98.5 F (36.9 C)] 98.1 F (36.7 C) (06/15 0341) Pulse Rate:  [71-88] 74 (06/14 2342) Cardiac Rhythm: Normal sinus rhythm (06/14 2100) Resp:  [15-20] 20 (06/15 0341) BP: (93-120)/(57-94) 93/57 (06/15 0341) SpO2:  [94 %-99 %] 94 % (06/15 0341)     Intake/Output from previous day: 06/14 0701 - 06/15 0700 In: 320 [P.O.:320] Out: 1431 [Urine:1275; Chest Tube:156] Intake/Output this shift: No intake/output data recorded.  General appearance: alert, cooperative, and no distress Heart: regular rate and rhythm, S1, S2 normal, no murmur, click, rub or gallop Lungs: clear to auscultation bilaterally Abdomen: soft, non-tender; bowel sounds normal; no masses,  no organomegaly Extremities: extremities normal, atraumatic, no cyanosis or edema Wound: clean and dry  Lab Results: No results for input(s): WBC, HGB, HCT, PLT in the last 72 hours. BMET:  Recent Labs    12/07/20 1658  NA 135  K 3.7  CL 100  CO2 29  GLUCOSE 110*  BUN 13  CREATININE 0.75  CALCIUM 8.4*    PT/INR: No results for input(s): LABPROT, INR in the last 72 hours. ABG No results found for: PHART, HCO3, TCO2, ACIDBASEDEF, O2SAT CBG (last 3)  No results for input(s): GLUCAP in the last 72 hours.  Assessment/Plan: S/P Procedure(s) (LRB): XI ROBOTIC ASSISTED THORASCOPY (Right) STAPLING OF BLEBS (Right) INTERCOSTAL NERVE BLOCK (Right)  Pulm- Waiting for CXR this morning, 4+ air leak with cough. 156cc/24 hours out of chest tube. Chest tube is to water seal.  CV- BP better  this morning, NSR in the 90s Labs ordered for tomorrow H and H 7.9/23.7, stable  Plan: Continue incentive spirometer, continue ambulation around the unit. Air leak remains, waiting for morning CXR. Otherwise has been progressing.   Addendum: CXR shows: Stable right chest tube and postoperative opacity in the right upper lobe. Small to moderate residual right pneumothorax with pleural edge visible laterally and at the lung base   LOS: 9 days    Victor Jefferson 12/10/2020  Patient seen and examined, agree with above He still has an air leak- Discussed options of IBV placement vs going home with CT in place Will change to miniexpress and reevaluate in AM  Dale C. Dorris Fetch, MD [facility] [phone]

## 2020-12-11 ENCOUNTER — Inpatient Hospital Stay (HOSPITAL_COMMUNITY): Payer: BLUE CROSS/BLUE SHIELD

## 2020-12-11 LAB — CBC
HCT: 27.3 % — ABNORMAL LOW (ref 39.0–52.0)
Hemoglobin: 8.9 g/dL — ABNORMAL LOW (ref 13.0–17.0)
MCH: 30.9 pg (ref 26.0–34.0)
MCHC: 32.6 g/dL (ref 30.0–36.0)
MCV: 94.8 fL (ref 80.0–100.0)
Platelets: 477 10*3/uL — ABNORMAL HIGH (ref 150–400)
RBC: 2.88 MIL/uL — ABNORMAL LOW (ref 4.22–5.81)
RDW: 13.7 % (ref 11.5–15.5)
WBC: 7.8 10*3/uL (ref 4.0–10.5)
nRBC: 0 % (ref 0.0–0.2)

## 2020-12-11 LAB — BASIC METABOLIC PANEL
Anion gap: 5 (ref 5–15)
BUN: 17 mg/dL (ref 6–20)
CO2: 30 mmol/L (ref 22–32)
Calcium: 8.7 mg/dL — ABNORMAL LOW (ref 8.9–10.3)
Chloride: 99 mmol/L (ref 98–111)
Creatinine, Ser: 0.93 mg/dL (ref 0.61–1.24)
GFR, Estimated: >60 mL/min (ref >60)
Glucose, Bld: 103 mg/dL — ABNORMAL HIGH (ref 70–99)
Potassium: 4.5 mmol/L (ref 3.5–5.1)
Sodium: 134 mmol/L — ABNORMAL LOW (ref 135–145)

## 2020-12-11 MED ORDER — OXYCODONE HCL 5 MG PO TABS: 5.0000 mg | ORAL_TABLET | Freq: Four times a day (QID) | ORAL | 0 refills | Status: DC | PRN

## 2020-12-11 NOTE — TOC Transition Note (Signed)
Transition of Care [facility]) - CM/SW Discharge Note   Patient Details  Name: Victor Jefferson MRN: [id number] Date of Birth: 07/28/1975  [facility] CM/SW Contact:  Leone Haven, RN Phone Number: 12/11/2020, 11:39 AM   Clinical Narrative:    Patient is for dc today, home with mini express chest tube, patient has been educated on how to drain the chest tube.  Per Denny Peon PA with TCT states patient will follow up with the office on Tuesday also for mini chest tube express.   Final next level of care: Home/Self Care Barriers to Discharge: No Barriers Identified   Patient Goals and CMS Choice        Discharge Placement                       Discharge Plan and Services                                     Social Determinants of Health (SDOH) Interventions     Readmission Risk Interventions No flowsheet data found.

## 2020-12-11 NOTE — Progress Notes (Signed)
Patient did return demonstration of how to empty the mini express including cleaning the port, measuring and flushing the discharge.  Writer changed dressing and provided additional supplies for properly caring for chest tube and changing the dressing as needed.patient verbalized understanding of the discharge instructions and who to call if he needs assistance.  Patient aware that there will be no HHN to assist him.  Follow up appointment will be on the 21st.  Written copy of instructions provided. Patient via wheelchair to waiting car in stable condition

## 2020-12-11 NOTE — Plan of Care (Signed)
  Problem: Education: Goal: Knowledge of General Education information will improve Description: Including pain rating scale, medication(s)/side effects and non-pharmacologic comfort measures Outcome: Completed/Met   Problem: Health Behavior/Discharge Planning: Goal: Ability to manage health-related needs will improve Outcome: Completed/Met   Problem: Clinical Measurements: Goal: Ability to maintain clinical measurements within normal limits will improve Outcome: Completed/Met Goal: Will remain free from infection Outcome: Completed/Met Goal: Diagnostic test results will improve Outcome: Completed/Met Goal: Respiratory complications will improve Outcome: Completed/Met Goal: Cardiovascular complication will be avoided Outcome: Completed/Met   Problem: Activity: Goal: Risk for activity intolerance will decrease Outcome: Completed/Met   Problem: Nutrition: Goal: Adequate nutrition will be maintained Outcome: Completed/Met   Problem: Coping: Goal: Level of anxiety will decrease Outcome: Completed/Met   Problem: Elimination: Goal: Will not experience complications related to bowel motility Outcome: Completed/Met Goal: Will not experience complications related to urinary retention Outcome: Completed/Met   Problem: Pain Managment: Goal: General experience of comfort will improve Outcome: Completed/Met   Problem: Safety: Goal: Ability to remain free from injury will improve Outcome: Completed/Met   Problem: Skin Integrity: Goal: Risk for impaired skin integrity will decrease Outcome: Completed/Met   Problem: Education: Goal: Knowledge of disease or condition will improve Outcome: Completed/Met Goal: Knowledge of the prescribed therapeutic regimen will improve Outcome: Completed/Met   Problem: Activity: Goal: Risk for activity intolerance will decrease Outcome: Completed/Met   Problem: Cardiac: Goal: Will achieve and/or maintain hemodynamic stability Outcome:  Completed/Met   Problem: Clinical Measurements: Goal: Postoperative complications will be avoided or minimized Outcome: Completed/Met   Problem: Respiratory: Goal: Respiratory status will improve Outcome: Completed/Met   Problem: Pain Management: Goal: Pain level will decrease Outcome: Completed/Met   Problem: Skin Integrity: Goal: Wound healing without signs and symptoms infection will improve Outcome: Completed/Met   

## 2020-12-11 NOTE — Progress Notes (Addendum)
      [address]       [place] [address]             [phone]      8 Days Post-Op Procedure(s) (LRB): XI ROBOTIC ASSISTED THORASCOPY (Right) STAPLING OF BLEBS (Right) INTERCOSTAL NERVE BLOCK (Right) Subjective: Feels okay this morning, He did have an episode yesterday where he felt lightheaded while walking, it lasted most of the time he was walking but wasn't severe enough to stop.  Objective: Vital signs in last 24 hours: Temp:  [98 F (36.7 C)-98.4 F (36.9 C)] 98.3 F (36.8 C) (06/16 0434) Pulse Rate:  [74-85] 77 (06/16 0434) Cardiac Rhythm: Normal sinus rhythm (06/15 1939) Resp:  [15-20] 20 (06/16 0434) BP: (90-108)/(60-73) 90/60 (06/16 0434) SpO2:  [94 %-96 %] 96 % (06/16 0434)     Intake/Output from previous day: 06/15 0701 - 06/16 0700 In: 480 [P.O.:480] Out: 800 [Urine:800] Intake/Output this shift: No intake/output data recorded.  General appearance: alert, cooperative, and no distress Heart: regular rate and rhythm, S1, S2 normal, no murmur, click, rub or gallop Lungs: clear to auscultation bilaterally Abdomen: soft, non-tender; bowel sounds normal; no masses,  no organomegaly Extremities: extremities normal, atraumatic, no cyanosis or edema Wound: clean and dry  Lab Results: Recent Labs    12/11/20 0019  WBC 7.8  HGB 8.9*  HCT 27.3*  PLT 477*   BMET:  Recent Labs    12/11/20 0019  NA 134*  K 4.5  CL 99  CO2 30  GLUCOSE 103*  BUN 17  CREATININE 0.93  CALCIUM 8.7*    PT/INR: No results for input(s): LABPROT, INR in the last 72 hours. ABG No results found for: PHART, HCO3, TCO2, ACIDBASEDEF, O2SAT CBG (last 3)  No results for input(s): GLUCAP in the last 72 hours.  Assessment/Plan: S/P Procedure(s) (LRB): XI ROBOTIC ASSISTED THORASCOPY (Right) STAPLING OF BLEBS (Right) INTERCOSTAL NERVE BLOCK (Right)  Pulm-tolerating mini express. Pneumo on CXR with apical and basilar component. Tolerating room air with good  oxygen saturation. No air leak. CV-BP remains borderline low. NSR in the 70s Renal-creatinine 0.93, electrolytes ok H and H 8.9/27.3, stable Not on lovenox due to bleeding. Ambulating multiple times a day  Plan: Will discuss possible discharge with Dr. Dorris Fetch. The patient does have help at home, his father is staying with him.    LOS: 10 days    Sharlene Dory 12/11/2020 Patient seen and examined, agree with above I do not see an air leak this morning but would not be comfortable pulling tube yet.  Will dc with CT and HHRN, will follow up early next week  Viviann Spare C. Dorris Fetch, MD [facility] [phone]

## 2020-12-15 ENCOUNTER — Other Ambulatory Visit: Payer: Self-pay | Admitting: Thoracic Surgery (Cardiothoracic Vascular Surgery)

## 2020-12-15 DIAGNOSIS — J9383 Other pneumothorax: Secondary | ICD-10-CM

## 2020-12-16 ENCOUNTER — Encounter: Payer: Self-pay | Admitting: Thoracic Surgery (Cardiothoracic Vascular Surgery)

## 2020-12-16 ENCOUNTER — Other Ambulatory Visit: Payer: Self-pay

## 2020-12-16 ENCOUNTER — Ambulatory Visit
Admission: RE | Admit: 2020-12-16 | Discharge: 2020-12-16 | Disposition: A | Discharge status: Home / Self Care | Payer: BLUE CROSS/BLUE SHIELD | Source: Ambulatory Visit | Attending: Thoracic Surgery (Cardiothoracic Vascular Surgery) | Admitting: Thoracic Surgery (Cardiothoracic Vascular Surgery)

## 2020-12-16 ENCOUNTER — Other Ambulatory Visit: Payer: Self-pay | Admitting: Thoracic Surgery (Cardiothoracic Vascular Surgery)

## 2020-12-16 ENCOUNTER — Ambulatory Visit (INDEPENDENT_AMBULATORY_CARE_PROVIDER_SITE_OTHER): Payer: Self-pay | Admitting: Thoracic Surgery (Cardiothoracic Vascular Surgery)

## 2020-12-16 ENCOUNTER — Telehealth: Payer: Self-pay

## 2020-12-16 VITALS — BP 109/68 | HR 99 | Temp 98.2°F | Resp 20 | Ht 71.0 in | Wt 124.0 lb

## 2020-12-16 DIAGNOSIS — Z9889 Other specified postprocedural states: Secondary | ICD-10-CM

## 2020-12-16 DIAGNOSIS — J9383 Other pneumothorax: Secondary | ICD-10-CM

## 2020-12-16 MED ORDER — OXYCODONE HCL 5 MG PO TABS: 5.0000 mg | ORAL_TABLET | Freq: Four times a day (QID) | ORAL | 0 refills | Status: AC | PRN

## 2020-12-16 NOTE — Telephone Encounter (Signed)
FMLA form competed for [employer]. Begnning leave 12/01/20 through approx 02/02/21. Patient will pickup form at front desk.

## 2020-12-16 NOTE — Progress Notes (Signed)
[address]       [name] [address]             [phone]     HPI: Victor Jefferson returns for follow-up after his recent surgery  Victor Jefferson is a 45 year old man with a past history of tobacco use, hepatitis B, and recurrent right spontaneous pneumothoraces.  He had a right VATS with blebectomy and pleural abrasion in 2017 after a second pneumothorax.  He did well for about 4 years but in May 2021 he had a recurrence.  That was treated conservatively with a chest tube.  He did not want to have a redo surgery at that time.  He recently returned with a recurrent right spontaneous pneumothorax.  This time he was agreeable to surgery and he had a robotic assisted VATS for resection of numerous blebs, pleural stripping of the apex, and pleural abrasion.  Postoperatively he had an air leak and he went home with a tube and although his leak was probably resolved at the time of discharge.  He went home last Thursday.  He has been using oxycodone occasionally.  He still has some left foot is running low.  He is not having any respiratory issues.  He has not noticed any air sounds or bubbling.  He emptied about 300 mL from his drainage tube last night. Current Outpatient Medications  Medication Sig Dispense Refill   acetaminophen (TYLENOL) 325 MG tablet Take 650 mg by mouth every 6 (six) hours as needed.     oxyCODONE (OXY IR/ROXICODONE) 5 MG immediate release tablet Take 1 tablet (5 mg total) by mouth every 6 (six) hours as needed for moderate pain or breakthrough pain. 30 tablet 0   No current facility-administered medications for this visit.    Physical Exam BP 109/68 (BP Location: Right Arm, Patient Position: Sitting)   Pulse 99   Temp 98.2 F (36.8 C)   Resp 20   Ht 5\' 11"  (1.803 m)   Wt 124 lb (56.2 kg)   SpO2 97% Comment: RA  BMI 17.3 kg/m  45 year old man in no acute distress Alert and oriented x3 with no focal deficits Lungs clear bilaterally Incisions  healing well Chest tube with no air leak, approximately 100 mL of serosanguineous fluid  Diagnostic Tests: I personally reviewed the chest x-ray.  It is unchanged from his film from 12/12/2019.  Both of those improved from his film on 12/11/2019  Impression: Victor Jefferson is a 45 year old man with a history of smoking, hepatitis B, and recurrent right spontaneous pneumothoraces.  He had numerous blebs involving all 3 lobes of the right lung.  He also has some significant blebs on the left side.  Presented back with a recurrent pneumothorax he agreed to have stapling of the blebs pleural stripping and pleural abrasion.  That was done on 12/04/2020.  He had an air leak initially.  It looks like it may have resolved but he was ready to go home so we sent him home with a chest tube in place.  On exam today he has a strong cough.  There is no evidence of a leak at all in the mini express.  We will discontinue the chest tube.  Procedure Right chest tube removed without difficulty and with minimal discomfort.  Plan: Will send for chest x-ray today. Return in 1 week with PA and lateral chest x-ray Will place prescription for additional oxycodone for pain as needed.  02/03/2021, MD  [facility] [phone]

## 2020-12-22 ENCOUNTER — Other Ambulatory Visit: Payer: Self-pay | Admitting: Thoracic Surgery (Cardiothoracic Vascular Surgery)

## 2020-12-22 DIAGNOSIS — J9383 Other pneumothorax: Secondary | ICD-10-CM

## 2020-12-23 ENCOUNTER — Ambulatory Visit (INDEPENDENT_AMBULATORY_CARE_PROVIDER_SITE_OTHER): Payer: Self-pay | Admitting: Thoracic Surgery (Cardiothoracic Vascular Surgery)

## 2020-12-23 ENCOUNTER — Other Ambulatory Visit: Payer: Self-pay

## 2020-12-23 ENCOUNTER — Ambulatory Visit
Admission: RE | Admit: 2020-12-23 | Discharge: 2020-12-23 | Disposition: A | Discharge status: Home / Self Care | Payer: BLUE CROSS/BLUE SHIELD | Source: Ambulatory Visit | Attending: Thoracic Surgery (Cardiothoracic Vascular Surgery) | Admitting: Thoracic Surgery (Cardiothoracic Vascular Surgery)

## 2020-12-23 VITALS — BP 117/77 | HR 96 | Resp 20 | Ht 71.0 in | Wt 124.0 lb

## 2020-12-23 DIAGNOSIS — J9383 Other pneumothorax: Secondary | ICD-10-CM

## 2020-12-23 DIAGNOSIS — Z9889 Other specified postprocedural states: Secondary | ICD-10-CM

## 2020-12-23 NOTE — Progress Notes (Signed)
[address]       [place] [address]             [phone]        HPI: [name] returns for a scheduled follow-up visit  Victor Jefferson is a 45 year old man with a history of tobacco abuse and recurrent right spontaneous pneumothoraces.  He had a previous VATS several years ago.  He presented back last year with a pneumothorax but did not want to undergo redo surgery.  He came back again with another pneumothorax this year.  He did agree to bleb resection.  He had numerous blebs and all 3 lobes and it was really quite an extensive stapling.  He had an air leak postoperatively and went home with a tube.  I saw him in the office last week and removed the tube.  He has not had any issues since then.  He says he still feels some tightness in the right chest around the incisions but no significant pain.  Past Medical History:  Diagnosis Date   Eczema    Spontaneous pneumothorax     Current Outpatient Medications  Medication Sig Dispense Refill   acetaminophen (TYLENOL) 325 MG tablet Take 650 mg by mouth every 6 (six) hours as needed.     oxyCODONE (OXY IR/ROXICODONE) 5 MG immediate release tablet Take 1 tablet (5 mg total) by mouth every 6 (six) hours as needed for breakthrough pain or severe pain. 30 tablet 0   No current facility-administered medications for this visit.    Physical Exam BP 117/77 (BP Location: Right Arm, Patient Position: Sitting)   Pulse 96   Resp 20   Ht 5\' 11"  (1.803 m)   Wt 124 lb (56.2 kg)   SpO2 98% Comment: RA  BMI 17.32 kg/m  45 year old man in no acute distress Alert and oriented x3 with no focal deficits Lungs clear and equal bilaterally Incisions well-healed  Diagnostic Tests: CHEST - 2 VIEW   COMPARISON:  Chest radiograph 12/16/2020 and earlier.   FINDINGS: Stable postoperative changes with volume loss in the right lung. Stable confluent right apical opacity with nearby surgical clips. Small right pneumothorax is no  longer evident. No layering pleural fluid. Stable cardiac size and mediastinal contours. Stable hyperinflated left lung with apical bullous changes. Visualized tracheal air column is within normal limits. No acute osseous abnormality identified. Paucity of bowel gas in the upper abdomen.   IMPRESSION: 1. Stable postoperative changes to the right lung with resolved pneumothorax since 12/16/2020. 2. No new cardiopulmonary abnormality.     Electronically Signed   By: 12/18/2020 M.D.   On: 12/23/2020 09:02 I personally reviewed the chest x-ray images.  No significant change from last week.  Impression: Victor Jefferson is a 45 year old man with a history of tobacco abuse and recurrent right spontaneous pneumothoraces.  He had a previous VATS several years ago but had a recurrent pneumothorax about a year ago.  He did not want another operation at that time but this year came back with a recurrent pneumothorax and did agree to go ahead with repeat bleb resection.  He is now about 3 weeks out from surgery.  He is doing well.  He is anxious to go back to work.  He does have to do a lot of heavy lifting but does have to reach over his head a lot.  I recommended he stay at least another couple of weeks.  We set a tentative  return to work for July 11.  He will see how it goes if he is not quite ready at that time we may need to extend it.  Plan: Return in 2 months with PA and lateral chest x-ray  Loreli Slot, MD [facility] [phone]

## 2021-01-29 ENCOUNTER — Encounter (HOSPITAL_COMMUNITY): Payer: Self-pay | Admitting: Thoracic Surgery (Cardiothoracic Vascular Surgery)

## 2021-02-23 ENCOUNTER — Other Ambulatory Visit: Payer: Self-pay | Admitting: Thoracic Surgery (Cardiothoracic Vascular Surgery)

## 2021-02-23 DIAGNOSIS — J9383 Other pneumothorax: Secondary | ICD-10-CM

## 2021-02-24 ENCOUNTER — Encounter: Payer: BLUE CROSS/BLUE SHIELD | Admitting: Thoracic Surgery (Cardiothoracic Vascular Surgery)

## 2021-02-24 ENCOUNTER — Other Ambulatory Visit: Payer: Self-pay

## 2021-02-24 ENCOUNTER — Ambulatory Visit (INDEPENDENT_AMBULATORY_CARE_PROVIDER_SITE_OTHER): Payer: Self-pay | Admitting: Physician Assistant

## 2021-02-24 ENCOUNTER — Encounter: Payer: Self-pay | Admitting: Physician Assistant

## 2021-02-24 ENCOUNTER — Ambulatory Visit
Admission: RE | Admit: 2021-02-24 | Discharge: 2021-02-24 | Disposition: A | Discharge status: Home / Self Care | Payer: BLUE CROSS/BLUE SHIELD | Source: Ambulatory Visit | Attending: Thoracic Surgery (Cardiothoracic Vascular Surgery) | Admitting: Thoracic Surgery (Cardiothoracic Vascular Surgery)

## 2021-02-24 VITALS — BP 130/88 | HR 76 | Resp 20 | Ht 71.0 in | Wt 123.0 lb

## 2021-02-24 DIAGNOSIS — J9383 Other pneumothorax: Secondary | ICD-10-CM

## 2021-02-24 DIAGNOSIS — Z9889 Other specified postprocedural states: Secondary | ICD-10-CM

## 2021-02-24 NOTE — Progress Notes (Addendum)
[address]       [name] [address]             [phone]        HPI: Victor Jefferson returns for a scheduled follow-up visit  Victor Jefferson is a 45 year old man with a history of tobacco abuse and recurrent right spontaneous pneumothoraces.  He had a previous VATS several years ago.  He presented back last year with a pneumothorax but did not want to undergo redo surgery.  He came back again with another pneumothorax this year.  He did agree to bleb resection.  He had numerous blebs and all 3 lobes and it was really quite an extensive stapling.  He had an air leak postoperatively and went home with a tube.  Dr. Dorris Fetch saw him in the office  and removed the tube.  He has not had any issues since then.  He says he still feels some tightness in the right chest around the incisions but no significant pain. He has not had any shortness of breath but is encouraged to use his incentive spirometer from time-to-time.   Past Medical History:  Diagnosis Date   Eczema    Spontaneous pneumothorax     Current Outpatient Medications  Medication Sig Dispense Refill   acetaminophen (TYLENOL) 325 MG tablet Take 650 mg by mouth every 6 (six) hours as needed.     oxyCODONE (OXY IR/ROXICODONE) 5 MG immediate release tablet Take 1 tablet (5 mg total) by mouth every 6 (six) hours as needed for breakthrough pain or severe pain. 30 tablet 0   No current facility-administered medications for this visit.    Physical Exam Today's Vitals   02/24/21 1321  BP: 130/88  Pulse: 76  Resp: 20  SpO2: 97%  Weight: 123 lb (55.8 kg)  Height: 5\' 11"  (1.803 m)   Body mass index is 17.1 kg/m.   45 year old man in no acute distress Alert and oriented x3 with no focal deficits RRR, no murmur Lungs clear and equal bilaterally Incisions well-healed  Diagnostic Tests:   2 view CXR:   CLINICAL DATA:  Hx right spontaneous PTX   EXAM: CHEST - 2 VIEW   COMPARISON:  12/23/2020    FINDINGS: Resolving right apical opacity with improving aeration at the right apex. Some residual scarring/consolidation is noted around the staple lines extending laterally. Left lung remains clear.   Heart size and mediastinal contours are within normal limits.   No effusion.   Visualized bones unremarkable.   IMPRESSION: Improving right apical postop opacity.  No acute findings.     Electronically Signed   By: 12/25/2020 M.D.   On: 02/24/2021 14:03   CHEST - 2 VIEW   COMPARISON:  Chest radiograph 12/16/2020 and earlier.   FINDINGS: Stable postoperative changes with volume loss in the right lung. Stable confluent right apical opacity with nearby surgical clips. Small right pneumothorax is no longer evident. No layering pleural fluid. Stable cardiac size and mediastinal contours. Stable hyperinflated left lung with apical bullous changes. Visualized tracheal air column is within normal limits. No acute osseous abnormality identified. Paucity of bowel gas in the upper abdomen.   IMPRESSION: 1. Stable postoperative changes to the right lung with resolved pneumothorax since 12/16/2020. 2. No new cardiopulmonary abnormality.     Electronically Signed   By: 12/18/2020 M.D.   On: 12/23/2020 09:02 I personally reviewed the chest x-ray images.  No significant change from last week.  Impression: Victor Jefferson is a 45 year old man with a history of tobacco abuse and recurrent right spontaneous pneumothoraces.  He had a previous VATS several years ago but had a recurrent pneumothorax about a year ago.  He did not want another operation at that time but this year came back with a recurrent pneumothorax and did agree to go ahead with repeat bleb resection.  He is now about 3 weeks out from surgery.  He is doing well.  He is anxious to go back to work.  He does have to do a lot of heavy lifting but does have to reach over his head a lot.  I recommended he stay at least another couple  of weeks.  We set a tentative return to work for July 11.  He will see how it goes if he is not quite ready at that time we may need to extend it.  Plan: Official read not back on the CXR but I do not see any obvious changes from his last one done on 6/28. He feels well and has started back working. He has had some pain with heavy lifting and I suggested sticking to a "light duty" schedule for now. We are happy to provide a letter to his employer is needed.   Follow-up as needed.   Jari Favre, PA-C [facility] [phone]

## 2022-06-02 ENCOUNTER — Ambulatory Visit: Payer: BLUE CROSS/BLUE SHIELD | Admitting: Family Medicine

## 2023-05-11 ENCOUNTER — Ambulatory Visit: Payer: Self-pay

## 2023-05-11 ENCOUNTER — Other Ambulatory Visit: Payer: Self-pay | Admitting: Nurse Practitioner

## 2023-05-11 DIAGNOSIS — Z021 Encounter for pre-employment examination: Secondary | ICD-10-CM
